# Patient Record
Sex: Female | Born: 1990
Health system: Southern US, Community
[De-identification: ages and names within clinical notes are randomized; demographics above are authoritative.]

## PROBLEM LIST (undated history)

## (undated) DIAGNOSIS — R030 Elevated blood-pressure reading, without diagnosis of hypertension: Secondary | ICD-10-CM

## (undated) DIAGNOSIS — M2340 Loose body in knee, unspecified knee: Secondary | ICD-10-CM

## (undated) DIAGNOSIS — K219 Gastro-esophageal reflux disease without esophagitis: Secondary | ICD-10-CM

## (undated) HISTORY — PX: KNEE ARTHROSCOPY: SUR90

---
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
## 2012-09-25 DIAGNOSIS — M2340 Loose body in knee, unspecified knee: Secondary | ICD-10-CM

## 2012-09-25 HISTORY — DX: Loose body in knee, unspecified knee: M23.40

## 2012-10-10 ENCOUNTER — Encounter (HOSPITAL_BASED_OUTPATIENT_CLINIC_OR_DEPARTMENT_OTHER): Payer: Self-pay | Admitting: *Deleted
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
## 2012-10-13 ENCOUNTER — Ambulatory Visit (HOSPITAL_BASED_OUTPATIENT_CLINIC_OR_DEPARTMENT_OTHER)
Admission: RE | Admit: 2012-10-13 | Discharge: 2012-10-13 | Disposition: A | Discharge status: Home / Self Care | Payer: No Typology Code available for payment source | Source: Ambulatory Visit | Attending: Orthopedic Surgery | Admitting: Orthopedic Surgery

## 2012-10-13 ENCOUNTER — Encounter (HOSPITAL_BASED_OUTPATIENT_CLINIC_OR_DEPARTMENT_OTHER): Payer: Self-pay | Admitting: *Deleted

## 2012-10-13 ENCOUNTER — Encounter (HOSPITAL_BASED_OUTPATIENT_CLINIC_OR_DEPARTMENT_OTHER)
Admission: RE | Disposition: A | Discharge status: Home / Self Care | Payer: Self-pay | Source: Ambulatory Visit | Attending: Orthopedic Surgery

## 2012-10-13 ENCOUNTER — Encounter (HOSPITAL_BASED_OUTPATIENT_CLINIC_OR_DEPARTMENT_OTHER): Payer: Self-pay | Admitting: Certified Registered"

## 2012-10-13 ENCOUNTER — Ambulatory Visit (HOSPITAL_BASED_OUTPATIENT_CLINIC_OR_DEPARTMENT_OTHER): Payer: No Typology Code available for payment source | Admitting: Certified Registered"

## 2012-10-13 DIAGNOSIS — K219 Gastro-esophageal reflux disease without esophagitis: Secondary | ICD-10-CM | POA: Insufficient documentation

## 2012-10-13 DIAGNOSIS — Z9889 Other specified postprocedural states: Secondary | ICD-10-CM

## 2012-10-13 DIAGNOSIS — M2240 Chondromalacia patellae, unspecified knee: Secondary | ICD-10-CM | POA: Insufficient documentation

## 2012-10-13 DIAGNOSIS — M2340 Loose body in knee, unspecified knee: Secondary | ICD-10-CM | POA: Insufficient documentation

## 2012-10-13 HISTORY — PX: KNEE ARTHROSCOPY: SHX127

## 2012-10-13 HISTORY — DX: Gastro-esophageal reflux disease without esophagitis: K21.9

## 2012-10-13 HISTORY — DX: Loose body in knee, unspecified knee: M23.40

## 2012-10-13 SURGERY — ARTHROSCOPY, KNEE
Anesthesia: General | Site: Knee | Laterality: Right

## 2012-10-13 MED ORDER — FENTANYL CITRATE 0.05 MG/ML IJ SOLN: 50.0000 ug | INTRAMUSCULAR | Status: DC | PRN

## 2012-10-13 MED ORDER — DEXAMETHASONE SODIUM PHOSPHATE 4 MG/ML IJ SOLN
INTRAMUSCULAR | Status: DC | PRN
  Administered 2012-10-13: 8 mg via INTRAVENOUS

## 2012-10-13 MED ORDER — ACETAMINOPHEN 10 MG/ML IV SOLN: 1000.0000 mg | Freq: Once | INTRAVENOUS | Status: DC

## 2012-10-13 MED ORDER — OXYCODONE HCL 5 MG PO TABS
5.0000 mg | ORAL_TABLET | Freq: Once | ORAL | Status: AC | PRN
  Administered 2012-10-13: 5 mg via ORAL

## 2012-10-13 MED ORDER — LACTATED RINGERS IV SOLN
INTRAVENOUS | Status: DC
  Administered 2012-10-13 (×2): via INTRAVENOUS

## 2012-10-13 MED ORDER — LIDOCAINE HCL (CARDIAC) 20 MG/ML IV SOLN
INTRAVENOUS | Status: DC | PRN
  Administered 2012-10-13: 50 mg via INTRAVENOUS

## 2012-10-13 MED ORDER — MIDAZOLAM HCL 5 MG/5ML IJ SOLN
INTRAMUSCULAR | Status: DC | PRN
  Administered 2012-10-13: 2 mg via INTRAVENOUS

## 2012-10-13 MED ORDER — CEFAZOLIN SODIUM-DEXTROSE 2-3 GM-% IV SOLR
2.0000 g | INTRAVENOUS | Status: AC
  Administered 2012-10-13: 2 g via INTRAVENOUS

## 2012-10-13 MED ORDER — ACETAMINOPHEN 10 MG/ML IV SOLN
1000.0000 mg | Freq: Once | INTRAVENOUS | Status: AC
  Administered 2012-10-13: 1000 mg via INTRAVENOUS

## 2012-10-13 MED ORDER — MIDAZOLAM HCL 2 MG/2ML IJ SOLN: 1.0000 mg | INTRAMUSCULAR | Status: DC | PRN

## 2012-10-13 MED ORDER — HYDROMORPHONE HCL PF 1 MG/ML IJ SOLN
0.2500 mg | INTRAMUSCULAR | Status: DC | PRN
  Administered 2012-10-13 (×2): 0.5 mg via INTRAVENOUS

## 2012-10-13 MED ORDER — FENTANYL CITRATE 0.05 MG/ML IJ SOLN
INTRAMUSCULAR | Status: DC | PRN
  Administered 2012-10-13: 50 ug via INTRAVENOUS
  Administered 2012-10-13: 100 ug via INTRAVENOUS
  Administered 2012-10-13: 50 ug via INTRAVENOUS

## 2012-10-13 MED ORDER — PROPOFOL 10 MG/ML IV BOLUS
INTRAVENOUS | Status: DC | PRN
  Administered 2012-10-13: 200 mg via INTRAVENOUS

## 2012-10-13 MED ORDER — HYDROCODONE-ACETAMINOPHEN 7.5-325 MG PO TABS: 1.0000 | ORAL_TABLET | Freq: Four times a day (QID) | ORAL | Status: DC | PRN

## 2012-10-13 MED ORDER — OXYCODONE HCL 5 MG/5ML PO SOLN: 5.0000 mg | Freq: Once | ORAL | Status: AC | PRN

## 2012-10-13 MED ORDER — KETOROLAC TROMETHAMINE 30 MG/ML IJ SOLN: 30.0000 mg | Freq: Once | INTRAMUSCULAR | Status: DC

## 2012-10-13 SURGICAL SUPPLY — 38 items
BANDAGE ELASTIC 6 VELCRO ST LF (GAUZE/BANDAGES/DRESSINGS) ×2 IMPLANT
BLADE CUDA 5.5 (BLADE) IMPLANT
BLADE CUDA GRT WHITE 3.5 (BLADE) IMPLANT
BLADE CUTTER GATOR 3.5 (BLADE) ×2 IMPLANT
BLADE CUTTER MENIS 5.5 (BLADE) IMPLANT
BLADE GREAT WHITE 4.2 (BLADE) ×2 IMPLANT
BUR OVAL 4.0 (BURR) IMPLANT
CANISTER OMNI JUG 16 LITER (MISCELLANEOUS) ×2 IMPLANT
CANISTER SUCTION 2500CC (MISCELLANEOUS) IMPLANT
CLOTH BEACON ORANGE TIMEOUT ST (SAFETY) ×2 IMPLANT
CUTTER MENISCUS  4.2MM (BLADE)
CUTTER MENISCUS 4.2MM (BLADE) IMPLANT
DRAPE ARTHROSCOPY W/POUCH 90 (DRAPES) ×2 IMPLANT
DURAPREP 26ML APPLICATOR (WOUND CARE) ×2 IMPLANT
ELECT MENISCUS 165MM 90D (ELECTRODE) IMPLANT
ELECT REM PT RETURN 9FT ADLT (ELECTROSURGICAL)
ELECTRODE REM PT RTRN 9FT ADLT (ELECTROSURGICAL) IMPLANT
GAUZE XEROFORM 1X8 LF (GAUZE/BANDAGES/DRESSINGS) ×2 IMPLANT
GLOVE BIO SURGEON STRL SZ 6.5 (GLOVE) ×2 IMPLANT
GLOVE BIOGEL PI IND STRL 7.0 (GLOVE) ×1 IMPLANT
GLOVE BIOGEL PI IND STRL 8 (GLOVE) ×1 IMPLANT
GLOVE BIOGEL PI INDICATOR 7.0 (GLOVE) ×1
GLOVE BIOGEL PI INDICATOR 8 (GLOVE) ×1
GLOVE ORTHO TXT STRL SZ7.5 (GLOVE) ×4 IMPLANT
GOWN BRE IMP PREV XXLGXLNG (GOWN DISPOSABLE) ×2 IMPLANT
GOWN PREVENTION PLUS XLARGE (GOWN DISPOSABLE) ×4 IMPLANT
HOLDER KNEE FOAM BLUE (MISCELLANEOUS) ×2 IMPLANT
KNEE WRAP E Z 3 GEL PACK (MISCELLANEOUS) ×2 IMPLANT
PACK ARTHROSCOPY DSU (CUSTOM PROCEDURE TRAY) ×2 IMPLANT
PACK BASIN DAY SURGERY FS (CUSTOM PROCEDURE TRAY) ×2 IMPLANT
PENCIL BUTTON HOLSTER BLD 10FT (ELECTRODE) IMPLANT
SET ARTHROSCOPY TUBING (MISCELLANEOUS) ×1
SET ARTHROSCOPY TUBING LN (MISCELLANEOUS) ×1 IMPLANT
SPONGE GAUZE 4X4 12PLY (GAUZE/BANDAGES/DRESSINGS) ×4 IMPLANT
SUT ETHILON 3 0 PS 1 (SUTURE) ×2 IMPLANT
SUT VIC AB 3-0 FS2 27 (SUTURE) IMPLANT
TOWEL OR 17X24 6PK STRL BLUE (TOWEL DISPOSABLE) ×2 IMPLANT
WATER STERILE IRR 1000ML POUR (IV SOLUTION) ×2 IMPLANT

## 2012-10-13 NOTE — Anesthesia Procedure Notes (Signed)
Procedure Name: LMA Insertion Performed by: Lance Coon Pre-anesthesia Checklist: Patient identified, Timeout performed, Emergency Drugs available, Suction available and Patient being monitored Patient Re-evaluated:Patient Re-evaluated prior to inductionOxygen Delivery Method: Circle system utilized Preoxygenation: Pre-oxygenation with 100% oxygen Intubation Type: IV induction Ventilation: Mask ventilation without difficulty LMA: LMA inserted LMA Size: 3.0 Number of attempts: 1 Placement Confirmation: breath sounds checked- equal and bilateral and positive ETCO2 Tube secured with: Tape Dental Injury: Teeth and Oropharynx as per pre-operative assessment

## 2012-10-13 NOTE — Brief Op Note (Signed)
10/13/2012  2:09 PM  PATIENT:  [name]  21 y.o. female  PRE-OPERATIVE DIAGNOSIS:  RIGHT KNEE LOOSE BODY - OTHER SPECIFIED SITES 718.18  POST-OPERATIVE DIAGNOSIS:  RIGHT KNEE LOOSE BODIES  PROCEDURE:  Procedure(s) (LRB) with comments: ARTHROSCOPY KNEE (Right) - RIGHT KNEE ARTHROSCOPY  REMOVAL LOOSE BODIES  SURGEON:  Surgeon(s) and Role:    * Loreta Ave, MD - Primary  PHYSICIAN ASSISTANT: Zonia Kief M     ANESTHESIA:   general  EBL:  Total I/O In: 1400 [I.V.:1400] Out: -   SPECIMEN:  No Specimen  DISPOSITION OF SPECIMEN:  N/A  COUNTS:  YES  TOURNIQUET:  * No tourniquets in log *   PATIENT DISPOSITION:  PACU - hemodynamically stable.

## 2012-10-13 NOTE — Anesthesia Preprocedure Evaluation (Signed)
Anesthesia Evaluation  Patient identified by MRN, date of birth, ID band Patient awake    Reviewed: Allergy & Precautions, H&P , NPO status , Patient's Chart, lab work & pertinent test results  Airway Mallampati: I TM Distance: >3 FB Neck ROM: Full    Dental No notable dental hx. (+) Teeth Intact and Dental Advisory Given   Pulmonary neg pulmonary ROS,  breath sounds clear to auscultation  Pulmonary exam normal       Cardiovascular negative cardio ROS  Rhythm:Regular Rate:Normal     Neuro/Psych negative neurological ROS  negative psych ROS   GI/Hepatic Neg liver ROS, GERD-  Medicated and Controlled,  Endo/Other  negative endocrine ROS  Renal/GU negative Renal ROS  negative genitourinary   Musculoskeletal   Abdominal   Peds  Hematology negative hematology ROS (+)   Anesthesia Other Findings   Reproductive/Obstetrics negative OB ROS                           Anesthesia Physical Anesthesia Plan  ASA: II  Anesthesia Plan: General   Post-op Pain Management:    Induction: Intravenous  Airway Management Planned: LMA  Additional Equipment:   Intra-op Plan:   Post-operative Plan: Extubation in OR  Informed Consent: I have reviewed the patients History and Physical, chart, labs and discussed the procedure including the risks, benefits and alternatives for the proposed anesthesia with the patient or authorized representative who has indicated his/her understanding and acceptance.   Dental advisory given  Plan Discussed with: CRNA  Anesthesia Plan Comments:         Anesthesia Quick Evaluation

## 2012-10-13 NOTE — Interval H&P Note (Signed)
History and Physical Interval Note:  10/13/2012 7:31 AM  [name]  has presented today for surgery, with the diagnosis of RIGHT KNEE LOOSE BODY - OTHER SPECIFIED SITES 718.18  The various methods of treatment have been discussed with the patient and family. After consideration of risks, benefits and other options for treatment, the patient has consented to  Procedure(s) (LRB) with comments: ARTHROSCOPY KNEE (Right) - RIGHT KNEE ARTHROSCOPY  REMOVAL LOOSE FOREIGN BODY as a surgical intervention .  The patient's history has been reviewed, patient examined, no change in status, stable for surgery.  I have reviewed the patient's chart and labs.  Questions were answered to the patient's satisfaction.     Jessamine Barcia F

## 2012-10-13 NOTE — Anesthesia Postprocedure Evaluation (Signed)
  Anesthesia Post-op Note  Patient: [name]  Procedure(s) Performed: Procedure(s) (LRB) with comments: ARTHROSCOPY KNEE (Right) - RIGHT KNEE ARTHROSCOPY  REMOVAL LOOSE BODIES  Patient Location: PACU  Anesthesia Type:General  Level of Consciousness: awake, alert  and oriented  Airway and Oxygen Therapy: Patient Spontanous Breathing  Post-op Pain: moderate  Post-op Assessment: Post-op Vital signs reviewed, Patient's Cardiovascular Status Stable, Respiratory Function Stable, Patent Airway and No signs of Nausea or vomiting  Post-op Vital Signs: Reviewed and stable  Complications: No apparent anesthesia complications

## 2012-10-13 NOTE — Transfer of Care (Signed)
Immediate Anesthesia Transfer of Care Note  Patient: [name]  Procedure(s) Performed: Procedure(s) (LRB) with comments: ARTHROSCOPY KNEE (Right) - RIGHT KNEE ARTHROSCOPY  REMOVAL LOOSE BODIES  Patient Location: PACU  Anesthesia Type:General  Level of Consciousness: awake and alert   Airway & Oxygen Therapy: Patient Spontanous Breathing and Patient connected to face mask oxygen  Post-op Assessment: Report given to PACU RN and Post -op Vital signs reviewed and stable  Post vital signs: Reviewed and stable  Complications: No apparent anesthesia complications

## 2012-10-14 ENCOUNTER — Encounter (HOSPITAL_BASED_OUTPATIENT_CLINIC_OR_DEPARTMENT_OTHER): Payer: Self-pay | Admitting: Orthopedic Surgery

## 2012-10-14 NOTE — Op Note (Signed)
NAMEDESTINA, MANTEI NO.:  [id number]  MEDICAL RECORD NO.:  [id number]  LOCATION:                                 FACILITY:  PHYSICIAN:  Loreta Ave, M.D. DATE OF BIRTH:  1991-02-25  DATE OF PROCEDURE:  10/13/2012 DATE OF DISCHARGE:                              OPERATIVE REPORT   POSTOPERATIVE DIAGNOSES: 1. Right knee status post stabilization, patellofemoral joint for     dislocation with solid reconstruction. 2. New traumatic event with chondromalacia patella and at least 1, if     not 2, osteochondral loose bodies.  POSTOPERATIVE DIAGNOSES: 1. Right knee status post stabilization, patellofemoral joint for     dislocation with solid reconstruction. 2. New traumatic event with chondromalacia patella and at least 1, if     not 2, osteochondral loose bodies. 3. One large osteochondral loose body, bilateral femoral condyle,     smaller one directly behind cruciate ligaments. 4. Focal grade 2, mild grade 3 chondromalacia of small area medial     patella with some chondral debris.  PROCEDURES: 1. Right knee exam under anesthesia, arthroscopy. 2. Chondroplasty patella. 3. Removal of loose bodies.  SURGEON:  Loreta Ave, MD  ASSISTANT:  Genene Churn. Barry Dienes, PA  ANESTHESIA:  General.  ESTIMATED BLOOD LOSS:  Minimal.  SPECIMENS:  None.  CULTURES:  None.  COMPLICATIONS:  None.  DRESSINGS:  Soft compressive.  TOURNIQUET:  Not employed.  DESCRIPTION OF PROCEDURE:  The patient was brought to the operating room, placed on the operating table in supine position.  After adequate anesthesia had been obtained, knee was examined.  Good motion, excellent patellar stability and tracking, no tethering.  Other ligaments stable. Leg holder applied.  Leg prepped and draped in usual sterile fashion. Two portals, one each medial and lateral parapatellar.  Arthroscope was introduced.  Knee was distended and inspected.  Focal area grade 2, mild grade 3  chondromalacia in a small area medial facet debrided.  No instability, good tracking, no tethering.  Trochlea looked good.  Little bit of chondral debris cleared out.  Medial meniscus, medial compartment, lateral meniscus, lateral compartment cruciate ligaments intact.  Looking behind the lateral femoral condyle through the notch, a 1-cm osteochondral loose body was found, brought out from the knee and removed with the large grasping forceps.  Another smaller one behind the cruciate ligaments removed.  I then sequentially examined the entire knee including posteromedial, posterolateral going between cruciate ligaments and down both gutters.  No other loose bodies seen. Instruments were removed.  Portals closed with nylon.  Knee injected with Marcaine.  Sterile compressive dressing applied.  Anesthesia reversed.  Brought to the recovery room.  Tolerated the surgery well. No complications.     Loreta Ave, M.D.     DFM/MEDQ  D:  10/13/2012  T:  10/14/2012  Job:  [id number]

## 2013-01-24 ENCOUNTER — Emergency Department: Payer: Self-pay | Admitting: Emergency Medicine

## 2016-01-17 ENCOUNTER — Other Ambulatory Visit: Payer: Self-pay | Admitting: Urgent Care

## 2016-01-17 DIAGNOSIS — R1084 Generalized abdominal pain: Secondary | ICD-10-CM

## 2016-01-18 ENCOUNTER — Emergency Department
Admission: EM | Admit: 2016-01-18 | Discharge: 2016-01-18 | Disposition: A | Discharge status: Home / Self Care | Payer: No Typology Code available for payment source | Attending: Emergency Medicine | Admitting: Emergency Medicine

## 2016-01-18 ENCOUNTER — Encounter: Payer: Self-pay | Admitting: Emergency Medicine

## 2016-01-18 DIAGNOSIS — R197 Diarrhea, unspecified: Secondary | ICD-10-CM | POA: Insufficient documentation

## 2016-01-18 DIAGNOSIS — Z888 Allergy status to other drugs, medicaments and biological substances status: Secondary | ICD-10-CM | POA: Insufficient documentation

## 2016-01-18 DIAGNOSIS — M545 Low back pain, unspecified: Secondary | ICD-10-CM

## 2016-01-18 DIAGNOSIS — R112 Nausea with vomiting, unspecified: Secondary | ICD-10-CM | POA: Insufficient documentation

## 2016-01-18 LAB — COMPREHENSIVE METABOLIC PANEL
ALBUMIN: 4.2 g/dL (ref 3.5–5.0)
ALT: 20 U/L (ref 14–54)
AST: 18 U/L (ref 15–41)
Alkaline Phosphatase: 80 U/L (ref 38–126)
Anion gap: 5 (ref 5–15)
BUN: 10 mg/dL (ref 6–20)
CHLORIDE: 108 mmol/L (ref 101–111)
CO2: 25 mmol/L (ref 22–32)
CREATININE: 0.65 mg/dL (ref 0.44–1.00)
Calcium: 8.7 mg/dL — ABNORMAL LOW (ref 8.9–10.3)
GFR calc Af Amer: >60 mL/min (ref >60)
GLUCOSE: 121 mg/dL — AB (ref 65–99)
POTASSIUM: 4.1 mmol/L (ref 3.5–5.1)
Sodium: 138 mmol/L (ref 135–145)
Total Bilirubin: 0.3 mg/dL (ref 0.3–1.2)
Total Protein: 7.6 g/dL (ref 6.5–8.1)

## 2016-01-18 LAB — CBC
HCT: 37.4 % (ref 35.0–47.0)
HEMOGLOBIN: 12.8 g/dL (ref 12.0–16.0)
MCH: 28.8 pg (ref 26.0–34.0)
MCHC: 34.3 g/dL (ref 32.0–36.0)
MCV: 84.1 fL (ref 80.0–100.0)
PLATELETS: 428 10*3/uL (ref 150–440)
RBC: 4.45 MIL/uL (ref 3.80–5.20)
RDW: 13.7 % (ref 11.5–14.5)
WBC: 7.8 10*3/uL (ref 3.6–11.0)

## 2016-01-18 LAB — URINALYSIS COMPLETE WITH MICROSCOPIC (ARMC ONLY)
Bacteria, UA: NONE SEEN
Bilirubin Urine: NEGATIVE
Glucose, UA: NEGATIVE mg/dL
Hgb urine dipstick: NEGATIVE
KETONES UR: NEGATIVE mg/dL
Nitrite: NEGATIVE
PH: 6 (ref 5.0–8.0)
PROTEIN: NEGATIVE mg/dL
Specific Gravity, Urine: 1.026 (ref 1.005–1.030)

## 2016-01-18 LAB — POCT PREGNANCY, URINE: PREG TEST UR: NEGATIVE

## 2016-01-18 MED ORDER — KETOROLAC TROMETHAMINE 30 MG/ML IJ SOLN
60.0000 mg | Freq: Once | INTRAMUSCULAR | Status: AC
  Administered 2016-01-18: 60 mg via INTRAMUSCULAR
  Filled 2016-01-18: qty 2

## 2016-01-18 MED ORDER — KETOROLAC TROMETHAMINE 10 MG PO TABS: 10.0000 mg | ORAL_TABLET | Freq: Three times a day (TID) | ORAL | Status: DC | PRN

## 2016-01-18 NOTE — ED Provider Notes (Signed)
[facility] Emergency Department Provider Note  ____________________________________________  Time seen: Approximately 2:38 PM  I have reviewed the triage vital signs and the nursing notes.   HISTORY  Chief Complaint Flank Pain    HPI [name] is a 25 y.o. female , otherwise healthy, presenting with low back pain. Patient states that 8 days ago she had 24 hours of nausea vomiting and diarrhea with epigastric pain that radiated to the back. She was out of town during this time and was seen by a physician who prescribed antinausea and antidiarrheal medications for her. Those symptoms resolved. Since then, she has had a diffuse low back pain.  She was given ciprofloxacin and naproxen 4 days ago.  She reports short periods of relief with naproxen, but no improvement with a heating pad. She also tried some hydrocodone which she had at home, but she has run out. She denies any fever, chills, urinary symptoms, hematuria, change in vaginal discharge. LMP 2/17.   Past Medical History  Diagnosis Date  . GERD (gastroesophageal reflux disease)     Zantac as needed  . Loose body in knee 09/2012    right    There are no active problems to display for this patient.   Past Surgical History  Procedure Laterality Date  . Knee arthroscopy  03/07/2009; and 2012    right  . Knee arthroscopy  10/13/2012    Procedure: ARTHROSCOPY KNEE;  Surgeon: Loreta Ave, MD;  Location: [facility];  Service: Orthopedics;  Laterality: Right;  RIGHT KNEE ARTHROSCOPY  REMOVAL LOOSE BODIES    Current Outpatient Rx  Name  Route  Sig  Dispense  Refill  . HYDROcodone-acetaminophen (NORCO) 7.5-325 MG per tablet   Oral   Take 1-2 tablets by mouth every 6 (six) hours as needed for pain.   60 tablet   0   . ketorolac (TORADOL) 10 MG tablet   Oral   Take 1 tablet (10 mg total) by mouth every 8 (eight) hours as needed for moderate pain (with food).   15 tablet   0   .  norethindrone-ethinyl estradiol 1/35 (ORTHO-NOVUM, NORTREL,CYCLAFEM) tablet   Oral   Take 1 tablet by mouth daily.         . ranitidine (ZANTAC) 150 MG tablet   Oral   Take 150 mg by mouth 2 (two) times daily.           Allergies Chlorhexidine gluconate and Voltaren  No family history on file.  Social History Social History  Substance Use Topics  . Smoking status: Never Smoker   . Smokeless tobacco: Never Used  . Alcohol Use: Yes    Review of Systems Constitutional: No fever/chillsArea and no syncope or lightheadedness. Eyes: No visual changes. ENT: No sore throat. No congestion or rhinorrhea. Cardiovascular: Denies chest pain, palpitations. Respiratory: Denies shortness of breath.  No cough. Gastrointestinal: Positive resolved epigastric pain.  Resolved nausea vomiting and diarrhea.  No constipation. Genitourinary: Negative for dysuria. No hematuria. No frequency. No change in vaginal discharge. Musculoskeletal: Negative for back pain. Skin: Negative for rash. Neurological: Negative for headaches, focal weakness or numbness.  10-point ROS otherwise negative.  ____________________________________________   PHYSICAL EXAM:  VITAL SIGNS: ED Triage Vitals  Enc Vitals Group     BP 01/18/16 1116 127/77 mmHg     Pulse Rate 01/18/16 1116 87     Resp 01/18/16 1116 20     Temp 01/18/16 1116 98.1 F (36.7 C)  Temp Source 01/18/16 1116 Oral     SpO2 01/18/16 1116 100 %     Weight 01/18/16 1116 175 lb (79.379 kg)     Height 01/18/16 1116 5\' 5"  (1.651 m)     Head Cir --      Peak Flow --      Pain Score 01/18/16 1116 6     Pain Loc --      Pain Edu? --      Excl. in GC? --     Constitutional: Alert and oriented. Well appearing and in no acute distress. Answer question appropriately. Eyes: Conjunctivae are normal.  EOMI.No scleral icterus. Head: Atraumatic. Nose: No congestion/rhinnorhea. Mouth/Throat: Mucous membranes are moist.  Neck: No stridor.  Supple.    Cardiovascular: Normal rate, regular rhythm. No murmurs, rubs or gallops.  Respiratory: Normal respiratory effort.  No retractions. Lungs CTAB.  No wheezes, rales or ronchi. Gastrointestinal: Abdomen is soft, nontender nondistended. No guarding or rebound, no peritoneal signs. Negative Murphy sign. No CVA tenderness bilaterally. Musculoskeletal: No LE edema. No midline C, T, L spine tenderness to palpation. No step-offs or deformities. Neurologic:  Normal speech and language. No gross focal neurologic deficits are appreciated.  Skin:  Skin is warm, dry and intact. No rash noted. Psychiatric: Mood and affect are normal. Speech and behavior are normal.  Normal judgement. ____________________________________________   LABS (all labs ordered are listed, but only abnormal results are displayed)  Labs Reviewed  URINALYSIS COMPLETEWITH MICROSCOPIC ([facility] ONLY) - Abnormal; Notable for the following:    Color, Urine YELLOW (*)    APPearance HAZY (*)    Leukocytes, UA 1+ (*)    Squamous Epithelial / LPF 0-5 (*)    All other components within normal limits  COMPREHENSIVE METABOLIC PANEL - Abnormal; Notable for the following:    Glucose, Bld 121 (*)    Calcium 8.7 (*)    All other components within normal limits  CBC  POC URINE PREG, ED  POCT PREGNANCY, URINE   ____________________________________________  EKG  Not indicated ____________________________________________  RADIOLOGY  No results found.  ____________________________________________   PROCEDURES  Procedure(s) performed: None  Critical Care performed: No ____________________________________________   INITIAL IMPRESSION / ASSESSMENT AND PLAN / ED COURSE  Pertinent labs & imaging results that were available during my care of the patient were reviewed by me and considered in my medical decision making (see chart for details).  25 y.o. female with resolved nausea vomiting and diarrhea as well as epigastric pain  presenting with residual low back pain. I am unable to reproduce her pain and she does not appear to have any flank pain or abdominal pain. It is possible that the patient has some musculoskeletal pain, or that she may have exacerbated her muscles during vomiting or with some mild dehydration earlier in the week. At this time, her vital signs are stable and she does not have any evidence of acute abdominal surgical pathology. I talked to her extensively about return precautions, but given her reassuring labs and negative urinalysis, we will plan to treat her conservatively with symptom management. Plan discharge.  ____________________________________________  FINAL CLINICAL IMPRESSION(S) / ED DIAGNOSES  Final diagnoses:  Midline low back pain without sciatica  Nausea vomiting and diarrhea      NEW MEDICATIONS STARTED DURING THIS VISIT:  New Prescriptions   KETOROLAC (TORADOL) 10 MG TABLET    Take 1 tablet (10 mg total) by mouth every 8 (eight) hours as needed for moderate pain (with food).  Rockne Menghini, MD 01/18/16 1443

## 2016-01-18 NOTE — ED Notes (Signed)
R flank pain x 6 days. Denies fevers. Denies dysuria.

## 2016-01-18 NOTE — Discharge Instructions (Signed)
Please apply a heating pad to your low back for 10 minutes every 2 hours. You may also take Toradol, with food, for pain.  You may alternate Toradol with Tylenol as needed.  Stop taking the Naproxsyn and do not take any other NSAID medications while you are taking Toradol.  Please make a follow up appointment with your primary care doctor, or a primary care doctor at the [facility].  Return to the emergency department for severe pain, fever, inability to keep down fluids or any other symptoms concerning to you.

## 2016-07-21 ENCOUNTER — Other Ambulatory Visit: Payer: Self-pay | Admitting: Gastroenterology

## 2016-07-21 DIAGNOSIS — R1013 Epigastric pain: Secondary | ICD-10-CM

## 2016-07-27 ENCOUNTER — Ambulatory Visit
Admission: RE | Admit: 2016-07-27 | Discharge: 2016-07-27 | Disposition: A | Discharge status: Home / Self Care | Payer: 59 | Source: Ambulatory Visit | Attending: Gastroenterology | Admitting: Gastroenterology

## 2016-07-27 DIAGNOSIS — R1013 Epigastric pain: Secondary | ICD-10-CM | POA: Insufficient documentation

## 2016-09-04 ENCOUNTER — Other Ambulatory Visit: Payer: Self-pay | Admitting: Gastroenterology

## 2016-09-04 DIAGNOSIS — R1013 Epigastric pain: Secondary | ICD-10-CM

## 2016-09-16 ENCOUNTER — Ambulatory Visit
Admission: RE | Admit: 2016-09-16 | Discharge: 2016-09-16 | Disposition: A | Discharge status: Home / Self Care | Payer: 59 | Source: Ambulatory Visit | Attending: Gastroenterology | Admitting: Gastroenterology

## 2016-09-16 ENCOUNTER — Encounter
Admission: RE | Admit: 2016-09-16 | Discharge: 2016-09-16 | Disposition: A | Discharge status: Home / Self Care | Payer: 59 | Source: Ambulatory Visit | Attending: Gastroenterology | Admitting: Gastroenterology

## 2016-09-16 DIAGNOSIS — R1013 Epigastric pain: Secondary | ICD-10-CM

## 2016-09-16 MED ORDER — TECHNETIUM TC 99M MEBROFENIN IV KIT
5.0000 | PACK | Freq: Once | INTRAVENOUS | Status: AC | PRN
  Administered 2016-09-16: 4.448 via INTRAVENOUS

## 2016-10-16 ENCOUNTER — Encounter
Admission: RE | Admit: 2016-10-16 | Discharge: 2016-10-16 | Disposition: A | Discharge status: Home / Self Care | Payer: 59 | Source: Ambulatory Visit | Attending: Surgery | Admitting: Surgery

## 2016-10-16 HISTORY — DX: Elevated blood-pressure reading, without diagnosis of hypertension: R03.0

## 2016-10-16 NOTE — Patient Instructions (Signed)
  Your procedure is scheduled on: 10-23-16 Report to Same Day Surgery 2nd floor medical mall [facility](Medical Mall Entrance-take elevator on left to 2nd floor.  Check in with surgery information desk.) To find out your arrival time please call [phone] between 1PM - 3PM on 10-22-16  Remember: Instructions that are not followed completely may result in serious medical risk, up to and including death, or upon the discretion of your surgeon and anesthesiologist your surgery may need to be rescheduled.    _x___ 1. Do not eat food or drink liquids after midnight. No gum chewing or hard candies.     __x__ 2. No Alcohol for 24 hours before or after surgery.   __x__3. No Smoking for 24 prior to surgery.   ____  4. Bring all medications with you on the day of surgery if instructed.    __x__ 5. Notify your doctor if there is any change in your medical condition     (cold, fever, infections).     Do not wear jewelry, make-up, hairpins, clips or nail polish.  Do not wear lotions, powders, or perfumes. You may wear deodorant.  Do not shave 48 hours prior to surgery. Men may shave face and neck.  Do not bring valuables to the hospital.    [facility] is not responsible for any belongings or valuables.               Contacts, dentures or bridgework may not be worn into surgery.  Leave your suitcase in the car. After surgery it may be brought to your room.  For patients admitted to the hospital, discharge time is determined by your treatment team.   Patients discharged the day of surgery will not be allowed to drive home.  You will need someone to drive you home and stay with you the night of your procedure.    Please read over the following fact sheets that you were given:   [facility] Preparing for Surgery and or MRSA Information   _x___ Take these medicines the morning of surgery with A SIP OF WATER:    1. PROTONIX  2. TAKE A PROTONIX ON Thursday NIGHT BEFORE BED (10-22-16)  3.  ZOLOFT  4.  5.  6.  ____Fleets enema or Magnesium Citrate as directed.   ____ Use CHG Soap or sage wipes as directed on instruction sheet   ____ Use inhalers on the day of surgery and bring to hospital day of surgery  ____ Stop metformin 2 days prior to surgery    ____ Take 1/2 of usual insulin dose the night before surgery and none on the morning of           surgery.   ____ Stop Aspirin, Coumadin, Pllavix ,Eliquis, Effient, or Pradaxa  x__ Stop Anti-inflammatories such as Advil, Aleve, Ibuprofen, Motrin, Naproxen,          Naprosyn, Goodies powders or aspirin products NOW-Ok to take Tylenol.   ____ Stop supplements until after surgery.    ____ Bring C-Pap to the hospital.

## 2016-10-23 ENCOUNTER — Ambulatory Visit
Admission: RE | Admit: 2016-10-23 | Discharge: 2016-10-23 | Disposition: A | Discharge status: Home / Self Care | Payer: 59 | Source: Ambulatory Visit | Attending: Surgery | Admitting: Surgery

## 2016-10-23 ENCOUNTER — Ambulatory Visit: Payer: 59

## 2016-10-23 ENCOUNTER — Ambulatory Visit: Payer: 59 | Admitting: Anesthesiology

## 2016-10-23 ENCOUNTER — Encounter
Admission: RE | Disposition: A | Discharge status: Home / Self Care | Payer: Self-pay | Source: Ambulatory Visit | Attending: Surgery

## 2016-10-23 DIAGNOSIS — F17200 Nicotine dependence, unspecified, uncomplicated: Secondary | ICD-10-CM | POA: Insufficient documentation

## 2016-10-23 DIAGNOSIS — F329 Major depressive disorder, single episode, unspecified: Secondary | ICD-10-CM | POA: Insufficient documentation

## 2016-10-23 DIAGNOSIS — F419 Anxiety disorder, unspecified: Secondary | ICD-10-CM | POA: Diagnosis not present

## 2016-10-23 DIAGNOSIS — K219 Gastro-esophageal reflux disease without esophagitis: Secondary | ICD-10-CM | POA: Diagnosis not present

## 2016-10-23 DIAGNOSIS — K811 Chronic cholecystitis: Secondary | ICD-10-CM | POA: Insufficient documentation

## 2016-10-23 DIAGNOSIS — Z419 Encounter for procedure for purposes other than remedying health state, unspecified: Secondary | ICD-10-CM

## 2016-10-23 DIAGNOSIS — Z888 Allergy status to other drugs, medicaments and biological substances status: Secondary | ICD-10-CM | POA: Insufficient documentation

## 2016-10-23 HISTORY — PX: CHOLECYSTECTOMY: SHX55

## 2016-10-23 LAB — POCT PREGNANCY, URINE: PREG TEST UR: NEGATIVE

## 2016-10-23 LAB — GLUCOSE, CAPILLARY: Glucose-Capillary: 112 mg/dL — ABNORMAL HIGH (ref 65–99)

## 2016-10-23 SURGERY — LAPAROSCOPIC CHOLECYSTECTOMY WITH INTRAOPERATIVE CHOLANGIOGRAM
Anesthesia: General | Wound class: Clean Contaminated

## 2016-10-23 MED ORDER — KETOROLAC TROMETHAMINE 30 MG/ML IJ SOLN
30.0000 mg | Freq: Once | INTRAMUSCULAR | Status: AC
  Administered 2016-10-23: 30 mg via INTRAVENOUS

## 2016-10-23 MED ORDER — ROCURONIUM BROMIDE 50 MG/5ML IV SOSY
PREFILLED_SYRINGE | INTRAVENOUS | Status: AC
  Filled 2016-10-23: qty 5

## 2016-10-23 MED ORDER — HYDROCODONE-ACETAMINOPHEN 5-325 MG PO TABS: 1.0000 | ORAL_TABLET | ORAL | 0 refills | Status: DC | PRN

## 2016-10-23 MED ORDER — MIDAZOLAM HCL 2 MG/2ML IJ SOLN
INTRAMUSCULAR | Status: AC
  Filled 2016-10-23: qty 2

## 2016-10-23 MED ORDER — MIDAZOLAM HCL 2 MG/2ML IJ SOLN
INTRAMUSCULAR | Status: DC | PRN
  Administered 2016-10-23: 2 mg via INTRAVENOUS

## 2016-10-23 MED ORDER — KETOROLAC TROMETHAMINE 30 MG/ML IJ SOLN
INTRAMUSCULAR | Status: AC
  Administered 2016-10-23: 30 mg via INTRAVENOUS
  Filled 2016-10-23: qty 1

## 2016-10-23 MED ORDER — ONDANSETRON HCL 4 MG/2ML IJ SOLN
INTRAMUSCULAR | Status: AC
  Filled 2016-10-23: qty 2

## 2016-10-23 MED ORDER — OXYCODONE HCL 5 MG PO TABS
ORAL_TABLET | ORAL | Status: AC
  Filled 2016-10-23: qty 1

## 2016-10-23 MED ORDER — OXYCODONE HCL 5 MG PO TABS
5.0000 mg | ORAL_TABLET | Freq: Once | ORAL | Status: AC
  Administered 2016-10-23: 5 mg via ORAL

## 2016-10-23 MED ORDER — ROCURONIUM BROMIDE 100 MG/10ML IV SOLN
INTRAVENOUS | Status: DC | PRN
  Administered 2016-10-23: 40 mg via INTRAVENOUS
  Administered 2016-10-23: 10 mg via INTRAVENOUS
  Administered 2016-10-23: 30 mg via INTRAVENOUS

## 2016-10-23 MED ORDER — LIDOCAINE HCL (CARDIAC) 20 MG/ML IV SOLN
INTRAVENOUS | Status: DC | PRN
  Administered 2016-10-23: 100 mg via INTRAVENOUS

## 2016-10-23 MED ORDER — ONDANSETRON HCL 4 MG/2ML IJ SOLN
INTRAMUSCULAR | Status: AC
  Administered 2016-10-23: 4 mg via INTRAVENOUS
  Filled 2016-10-23: qty 2

## 2016-10-23 MED ORDER — LIDOCAINE 2% (20 MG/ML) 5 ML SYRINGE
INTRAMUSCULAR | Status: AC
  Filled 2016-10-23: qty 5

## 2016-10-23 MED ORDER — PROPOFOL 10 MG/ML IV BOLUS
INTRAVENOUS | Status: AC
  Filled 2016-10-23: qty 20

## 2016-10-23 MED ORDER — SUGAMMADEX SODIUM 200 MG/2ML IV SOLN
INTRAVENOUS | Status: DC | PRN
  Administered 2016-10-23: 180 mg via INTRAVENOUS

## 2016-10-23 MED ORDER — DEXAMETHASONE SODIUM PHOSPHATE 10 MG/ML IJ SOLN
INTRAMUSCULAR | Status: DC | PRN
  Administered 2016-10-23: 10 mg via INTRAVENOUS

## 2016-10-23 MED ORDER — FENTANYL CITRATE (PF) 100 MCG/2ML IJ SOLN
INTRAMUSCULAR | Status: DC | PRN
  Administered 2016-10-23 (×2): 50 ug via INTRAVENOUS
  Administered 2016-10-23: 100 ug via INTRAVENOUS
  Administered 2016-10-23: 50 ug via INTRAVENOUS

## 2016-10-23 MED ORDER — SODIUM CHLORIDE 0.9 % IV SOLN
INTRAVENOUS | Status: DC | PRN
  Administered 2016-10-23: 5 mL

## 2016-10-23 MED ORDER — FENTANYL CITRATE (PF) 100 MCG/2ML IJ SOLN
INTRAMUSCULAR | Status: AC
  Filled 2016-10-23: qty 2

## 2016-10-23 MED ORDER — LACTATED RINGERS IV SOLN
INTRAVENOUS | Status: DC
  Administered 2016-10-23 (×2): via INTRAVENOUS

## 2016-10-23 MED ORDER — HYDROCODONE-ACETAMINOPHEN 5-325 MG PO TABS
ORAL_TABLET | ORAL | Status: AC
  Filled 2016-10-23: qty 1

## 2016-10-23 MED ORDER — SEVOFLURANE IN SOLN
RESPIRATORY_TRACT | Status: AC
  Filled 2016-10-23: qty 250

## 2016-10-23 MED ORDER — ONDANSETRON HCL 4 MG/2ML IJ SOLN
4.0000 mg | Freq: Once | INTRAMUSCULAR | Status: AC | PRN
  Administered 2016-10-23: 4 mg via INTRAVENOUS

## 2016-10-23 MED ORDER — SODIUM CHLORIDE 0.9 % IJ SOLN
INTRAMUSCULAR | Status: AC
  Filled 2016-10-23: qty 10

## 2016-10-23 MED ORDER — FENTANYL CITRATE (PF) 250 MCG/5ML IJ SOLN
INTRAMUSCULAR | Status: AC
  Filled 2016-10-23: qty 5

## 2016-10-23 MED ORDER — HYDROCODONE-ACETAMINOPHEN 5-325 MG PO TABS
1.0000 | ORAL_TABLET | ORAL | Status: DC | PRN
  Administered 2016-10-23: 1 via ORAL

## 2016-10-23 MED ORDER — DEXAMETHASONE SODIUM PHOSPHATE 10 MG/ML IJ SOLN
INTRAMUSCULAR | Status: AC
  Filled 2016-10-23: qty 1

## 2016-10-23 MED ORDER — PROPOFOL 10 MG/ML IV BOLUS
INTRAVENOUS | Status: DC | PRN
  Administered 2016-10-23: 170 mg via INTRAVENOUS

## 2016-10-23 MED ORDER — SODIUM CHLORIDE 0.9 % IV SOLN
INTRAVENOUS | Status: DC | PRN
  Administered 2016-10-23: 50 mL via INTRAMUSCULAR

## 2016-10-23 MED ORDER — SODIUM CHLORIDE 0.9 % IJ SOLN
INTRAMUSCULAR | Status: AC
  Filled 2016-10-23: qty 50

## 2016-10-23 MED ORDER — SUGAMMADEX SODIUM 200 MG/2ML IV SOLN
INTRAVENOUS | Status: AC
  Filled 2016-10-23: qty 2

## 2016-10-23 MED ORDER — FENTANYL CITRATE (PF) 100 MCG/2ML IJ SOLN
25.0000 ug | INTRAMUSCULAR | Status: AC | PRN
  Administered 2016-10-23 (×6): 25 ug via INTRAVENOUS

## 2016-10-23 MED ORDER — HEPARIN SODIUM (PORCINE) 5000 UNIT/ML IJ SOLN
INTRAMUSCULAR | Status: AC
  Filled 2016-10-23: qty 1

## 2016-10-23 MED ORDER — ONDANSETRON HCL 4 MG/2ML IJ SOLN
INTRAMUSCULAR | Status: DC | PRN
  Administered 2016-10-23 (×2): 4 mg via INTRAVENOUS

## 2016-10-23 SURGICAL SUPPLY — 43 items
APPLIER CLIP ROT 10 11.4 M/L (STAPLE) ×3
CANISTER SUCT 1200ML W/VALVE (MISCELLANEOUS) ×3 IMPLANT
CANNULA DILATOR 10 W/SLV (CANNULA) ×2 IMPLANT
CANNULA DILATOR 10MM W/SLV (CANNULA) ×1
CATH REDDICK CHOLANGI 4FR 50CM (CATHETERS) ×3 IMPLANT
CHLORAPREP W/TINT 26ML (MISCELLANEOUS) IMPLANT
CLIP APPLIE ROT 10 11.4 M/L (STAPLE) ×1 IMPLANT
CLOSURE WOUND 1/2 X4 (GAUZE/BANDAGES/DRESSINGS) ×1
DRAPE SHEET LG 3/4 BI-LAMINATE (DRAPES) ×3 IMPLANT
DURAPREP 26ML APPLICATOR (WOUND CARE) ×3 IMPLANT
ELECT REM PT RETURN 9FT ADLT (ELECTROSURGICAL) ×3
ELECTRODE REM PT RTRN 9FT ADLT (ELECTROSURGICAL) ×1 IMPLANT
GAUZE SPONGE 4X4 12PLY STRL (GAUZE/BANDAGES/DRESSINGS) ×3 IMPLANT
GLOVE BIO SURGEON STRL SZ 6.5 (GLOVE) ×4 IMPLANT
GLOVE BIO SURGEON STRL SZ7.5 (GLOVE) ×6 IMPLANT
GLOVE BIO SURGEONS STRL SZ 6.5 (GLOVE) ×2
GLOVE SURG SYN 6.5 ES PF (GLOVE) ×6 IMPLANT
GOWN STRL REUS W/ TWL LRG LVL3 (GOWN DISPOSABLE) ×4 IMPLANT
GOWN STRL REUS W/TWL LRG LVL3 (GOWN DISPOSABLE) ×8
IRRIGATION STRYKERFLOW (MISCELLANEOUS) ×1 IMPLANT
IRRIGATOR STRYKERFLOW (MISCELLANEOUS) ×3
IV NS 1000ML (IV SOLUTION) ×2
IV NS 1000ML BAXH (IV SOLUTION) ×1 IMPLANT
KIT RM TURNOVER STRD PROC AR (KITS) ×3 IMPLANT
LABEL OR SOLS (LABEL) IMPLANT
NDL INSUFF ACCESS 14 VERSASTEP (NEEDLE) ×3 IMPLANT
NEEDLE FILTER BLUNT 18X 1/2SAF (NEEDLE) ×2
NEEDLE FILTER BLUNT 18X1 1/2 (NEEDLE) ×1 IMPLANT
NS IRRIG 500ML POUR BTL (IV SOLUTION) ×3 IMPLANT
PACK LAP CHOLECYSTECTOMY (MISCELLANEOUS) ×3 IMPLANT
SCISSORS METZENBAUM CVD 33 (INSTRUMENTS) ×3 IMPLANT
SEAL FOR SCOPE WARMER C3101 (MISCELLANEOUS) ×3 IMPLANT
SLEEVE ENDOPATH XCEL 5M (ENDOMECHANICALS) ×3 IMPLANT
STRIP CLOSURE SKIN 1/2X4 (GAUZE/BANDAGES/DRESSINGS) ×2 IMPLANT
SUT CHROMIC 5 0 RB 1 27 (SUTURE) ×3 IMPLANT
SUT VIC AB 0 CT2 27 (SUTURE) IMPLANT
SWABSTK COMLB BENZOIN TINCTURE (MISCELLANEOUS) ×3 IMPLANT
SYR 3ML LL SCALE MARK (SYRINGE) ×3 IMPLANT
TROCAR XCEL NON-BLD 11X100MML (ENDOMECHANICALS) ×3 IMPLANT
TROCAR XCEL NON-BLD 5MMX100MML (ENDOMECHANICALS) ×3 IMPLANT
TROCAR XCEL UNIV SLVE 11M 100M (ENDOMECHANICALS) ×3 IMPLANT
TUBING INSUFFLATOR HI FLOW (MISCELLANEOUS) ×3 IMPLANT
WATER STERILE IRR 1000ML POUR (IV SOLUTION) ×3 IMPLANT

## 2016-10-23 NOTE — Anesthesia Postprocedure Evaluation (Signed)
Anesthesia Post Note  Patient: [name]  Procedure(s) Performed: Procedure(s) (LRB): LAPAROSCOPIC CHOLECYSTECTOMY WITH INTRAOPERATIVE CHOLANGIOGRAM (N/A)  Patient location during evaluation: PACU Anesthesia Type: General Level of consciousness: awake and alert and oriented Pain management: pain level controlled Vital Signs Assessment: post-procedure vital signs reviewed and stable Respiratory status: spontaneous breathing, nonlabored ventilation and respiratory function stable Cardiovascular status: blood pressure returned to baseline and stable Postop Assessment: no signs of nausea or vomiting Anesthetic complications: no     Last Vitals:  Vitals:   10/23/16 1808 10/23/16 1827  BP: (!) 158/98 130/82  Pulse: 82 84  Resp: 18 18  Temp: 37 C     Last Pain:  Vitals:   10/23/16 1827  TempSrc:   PainSc: 3                  Nani Ingram

## 2016-10-23 NOTE — Anesthesia Preprocedure Evaluation (Addendum)
Anesthesia Evaluation  Patient identified by MRN, date of birth, ID band Patient awake    Reviewed: Allergy & Precautions, H&P , NPO status , Patient's Chart, lab work & pertinent test results  Airway Mallampati: I  TM Distance: >3 FB Neck ROM: Full    Dental no notable dental hx. (+) Teeth Intact, Dental Advisory Given   Pulmonary neg pulmonary ROS, Current Smoker,    Pulmonary exam normal breath sounds clear to auscultation       Cardiovascular negative cardio ROS   Rhythm:Regular Rate:Normal     Neuro/Psych negative neurological ROS  negative psych ROS   GI/Hepatic Neg liver ROS, GERD  Medicated and Controlled,  Endo/Other  negative endocrine ROS  Renal/GU negative Renal ROS  negative genitourinary   Musculoskeletal negative musculoskeletal ROS (+)   Abdominal Normal abdominal exam  (+)   Peds negative pediatric ROS (+)  Hematology negative hematology ROS (+)   Anesthesia Other Findings   Reproductive/Obstetrics negative OB ROS                             Anesthesia Physical  Anesthesia Plan  ASA: II  Anesthesia Plan: General   Post-op Pain Management:    Induction: Intravenous  Airway Management Planned: Oral ETT  Additional Equipment:   Intra-op Plan:   Post-operative Plan: Extubation in OR  Informed Consent: I have reviewed the patients History and Physical, chart, labs and discussed the procedure including the risks, benefits and alternatives for the proposed anesthesia with the patient or authorized representative who has indicated his/her understanding and acceptance.   Dental advisory given  Plan Discussed with: CRNA and Surgeon  Anesthesia Plan Comments:         Anesthesia Quick Evaluation

## 2016-10-23 NOTE — H&P (Signed)
  She has had some mild nausea since yesterday. No additional severe pains since office visit. No change in condition since the office visit.  Lab work noted  I discussed the plan for laparoscopic cholecystectomy

## 2016-10-23 NOTE — Op Note (Signed)
OPERATIVE REPORT  PREOPERATIVE DIAGNOSIS:  Chronic cholecystitis   POSTOPERATIVE DIAGNOSIS: Chronic cholecystitis   PROCEDURE: Laparoscopic cholecystectomy with cholangiogram  ANESTHESIA: General  SURGEON: Renda RollsWilton Smith M.D.  INDICATIONS: She is a history of episodes of severe epigastric pain and also has had pain radiating into the back. She has had a abnormal low gallbladder ejection fraction of 32% and surgery was recommended for definitive treatment.  With the patient on the operating table in the supine position under general endotracheal anesthesia the abdomen was prepared with DuraPrep solution and draped in a sterile manner. A short incision was made in the inferior aspect of the umbilicus and carried down to the deep fascia which was grasped with a laryngeal hook. A Veress needle was inserted aspirated and irrigated with a saline solution. Saline did not flow and easily. The Veress needle was removed. Subsequently the 10 mm 0 laparoscope was placed into the 11 mm cannula and focused and advanced through the abdominal wall through the infraumbilical incision  and entered the peritoneal cavity. The peritoneal cavity was insufflated with carbon dioxide.  Another incision was made in the epigastrium slightly to the right of the midline to introduce an 11 mm cannula. 2 incisions were made in the lateral aspect of the right upper quadrant to introduce 2   5 mm cannulas. Initial inspection revealed a smooth surface of the liver and typical appearance of stomach and visible small and large bowel.  The gallbladder was retracted towards the right shoulder.  The gallbladder neck was retracted inferiorly and laterally.  The porta hepatis was identified. The gallbladder was mobilized with incision of the visceral peritoneum. The cystic artery was dissected free from surrounding structures. The cystic duct was dissected free from surrounding structures. The common bile duct was identified. A critical view  of safety was demonstrated. The cystic artery was anterior to the cystic duct and therefore the cystic artery was controlled with double endoclips and divided. This led to satisfactory exposure of the cystic duct.  An Endo Clip was placed across the cystic duct adjacent to the gallbladder neck. An incision was made in the cystic duct to introduce a Reddick catheter. The cholangiogram was done with injection of half-strength Conray 60 dye. This demonstrated the bile ducts and flow of dye into the duodenum. No retained stones were identified. The cholangiogram appeared normal. The Reddick catheter was removed. The cystic duct was doubly ligated with endoclips and divided. The gallbladder was dissected free from the liver with use of hook and cautery and blunt dissection. Bleeding was minimal and hemostasis was intact. The gallbladder was delivered up through the infraumbilical incision opened and suctioned. Cholesterolosis was identified. The gallbladder was removed and submitted for routine pathology. The right upper quadrant was further inspected and could see hemostasis was intact. The cannulas removed allowing carbon dioxide to escape from the peritoneal cavity. Skin incisions were closed with interrupted 5-0 chromic subcuticular suture benzoin and Steri-Strips. Gauze dressings were applied with paper tape.   The patient appeared to be in satisfactory condition and was prepared for transfer to the recovery room  Renda RollsWilton Smith M.D.

## 2016-10-23 NOTE — Anesthesia Procedure Notes (Signed)
Procedure Name: Intubation Date/Time: 10/23/2016 3:08 PM Performed by: Michaele OfferSAVAGE, Kylle Lall Pre-anesthesia Checklist: Patient identified, Emergency Drugs available, Suction available, Patient being monitored and Timeout performed Patient Re-evaluated:Patient Re-evaluated prior to inductionOxygen Delivery Method: Circle system utilized Preoxygenation: Pre-oxygenation with 100% oxygen Intubation Type: IV induction Ventilation: Mask ventilation without difficulty Laryngoscope Size: Mac and 3 Grade View: Grade I Tube type: Oral Tube size: 7.0 mm Number of attempts: 1 Airway Equipment and Method: Stylet Placement Confirmation: ETT inserted through vocal cords under direct vision,  positive ETCO2 and breath sounds checked- equal and bilateral Secured at: 19 cm Tube secured with: Tape Dental Injury: Teeth and Oropharynx as per pre-operative assessment

## 2016-10-23 NOTE — Discharge Instructions (Addendum)
Take Tylenol or Norco if needed for pain.  Should not drive or do anything dangerous when taking Norco.  Remove dressings on Saturday. May shower Sunday.  Gradually resume usual activities and diet as tolerated.     AMBULATORY SURGERY  DISCHARGE INSTRUCTIONS   1) The drugs that you were given will stay in your system until tomorrow so for the next 24 hours you should not:  A) Drive an automobile B) Make any legal decisions C) Drink any alcoholic beverage   2) You may resume regular meals tomorrow.  Today it is better to start with liquids and gradually work up to solid foods.  You may eat anything you prefer, but it is better to start with liquids, then soup and crackers, and gradually work up to solid foods.   3) Please notify your doctor immediately if you have any unusual bleeding, trouble breathing, redness and pain at the surgery site, drainage, fever, or pain not relieved by medication.    4) Additional Instructions:        Please contact your physician with any problems or Same Day Surgery at [phone], Monday through Friday 6 am to 4 pm, or [place] at [facility] Main number at [phone].

## 2016-10-23 NOTE — Transfer of Care (Signed)
Immediate Anesthesia Transfer of Care Note  Patient: [name]  Procedure(s) Performed: Procedure(s): LAPAROSCOPIC CHOLECYSTECTOMY WITH INTRAOPERATIVE CHOLANGIOGRAM (N/A)  Patient Location: PACU  Anesthesia Type:General  Level of Consciousness: sedated and responds to stimulation  Airway & Oxygen Therapy: Patient Spontanous Breathing and Patient connected to face mask oxygen  Post-op Assessment: Report given to RN and Post -op Vital signs reviewed and stable  Post vital signs: Reviewed  Last Vitals:  Vitals:   10/23/16 1252 10/23/16 1708  BP: (!) 150/91 115/88  Pulse: 89 98  Resp: 16 13  Temp: 36.7 C 36.3 C    Last Pain:  Vitals:   10/23/16 1252  TempSrc: Tympanic         Complications: No apparent anesthesia complications

## 2016-10-24 ENCOUNTER — Encounter: Payer: Self-pay | Admitting: Surgery

## 2016-10-28 LAB — SURGICAL PATHOLOGY

## 2016-12-25 ENCOUNTER — Ambulatory Visit
Admission: RE | Admit: 2016-12-25 | Payer: BLUE CROSS/BLUE SHIELD | Source: Ambulatory Visit | Admitting: Gastroenterology

## 2016-12-25 ENCOUNTER — Encounter: Admission: RE | Payer: Self-pay | Source: Ambulatory Visit

## 2016-12-25 SURGERY — EGD (ESOPHAGOGASTRODUODENOSCOPY)
Anesthesia: General

## 2017-01-11 ENCOUNTER — Other Ambulatory Visit: Payer: Self-pay | Admitting: Internal Medicine

## 2017-01-11 DIAGNOSIS — R7989 Other specified abnormal findings of blood chemistry: Secondary | ICD-10-CM

## 2017-01-11 DIAGNOSIS — E039 Hypothyroidism, unspecified: Secondary | ICD-10-CM

## 2017-01-22 ENCOUNTER — Ambulatory Visit
Admission: RE | Admit: 2017-01-22 | Discharge: 2017-01-22 | Disposition: A | Discharge status: Home / Self Care | Payer: BLUE CROSS/BLUE SHIELD | Source: Ambulatory Visit | Attending: Internal Medicine | Admitting: Internal Medicine

## 2017-01-22 DIAGNOSIS — R7989 Other specified abnormal findings of blood chemistry: Secondary | ICD-10-CM | POA: Insufficient documentation

## 2017-01-22 DIAGNOSIS — E039 Hypothyroidism, unspecified: Secondary | ICD-10-CM

## 2017-01-22 DIAGNOSIS — E038 Other specified hypothyroidism: Secondary | ICD-10-CM | POA: Insufficient documentation

## 2017-01-22 MED ORDER — GADOBENATE DIMEGLUMINE 529 MG/ML IV SOLN
10.0000 mL | Freq: Once | INTRAVENOUS | Status: AC | PRN
  Administered 2017-01-22: 10 mL via INTRAVENOUS

## 2017-11-06 IMAGING — NM NM HEPATO W/GB/PHARM/[PERSON_NAME]
3 series · 18 of 18 positions shown · non-contrast
Comparison: None

CLINICAL DATA: RIGHT upper quadrant abdominal pain, nausea,
vomiting, episodic since December 2015

EXAM:
NUCLEAR MEDICINE HEPATOBILIARY IMAGING WITH GALLBLADDER EF
TECHNIQUE: Sequential images of the abdomen were obtained [DATE] minutes
following intravenous administration of radiopharmaceutical. After
oral ingestion of Ensure, gallbladder ejection fraction was
determined. At 60 min, normal ejection fraction is greater than 33%.
RADIOPHARMACEUTICALS:  4.448 mCi 2c-22m  Choletec IV

[Series 1000: gallbladder ef dynamic (results) · 4.80mm/px · 6 of 120 frames shown]
[frame 11/120]
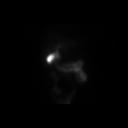
[frame 31/120]
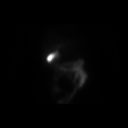
[frame 51/120]
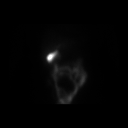
[frame 71/120]
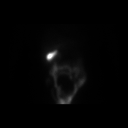
[frame 91/120]
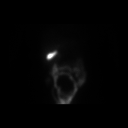
[frame 111/120]
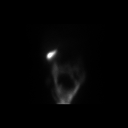

[Series 1000: gallbladder ef dynamic · 4.80mm/px · 6 of 120 frames shown]
[frame 11/120]
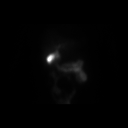
[frame 31/120]
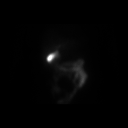
[frame 51/120]
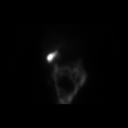
[frame 71/120]
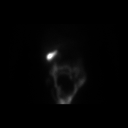
[frame 91/120]
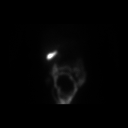
[frame 111/120]
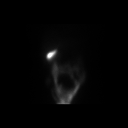

[Series 1000: hepatobiliary dynamic · 9.59mm/px · 6 of 60 frames shown]
[frame 6/60]
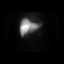
[frame 16/60]
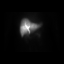
[frame 26/60]
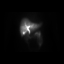
[frame 36/60]
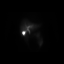
[frame 46/60]
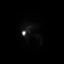
[frame 56/60]
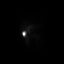

[18 of 18 positions shown; findings below may reference images not displayed]

FINDINGS: Normal tracer extraction from bloodstream indicating normal
hepatocellular function.

Normal excretion of tracer into biliary tree.

Gallbladder visualized at 28 min.

Small bowel visualized at 22 min.

No hepatic retention of tracer.

Subjectively normal emptying of tracer from gallbladder following
fatty meal stimulation.

Calculated gallbladder ejection fraction (GBEF) is borderline low at
32%.

Patient reported RIGHT upper quadrant pain following Ensure
ingestion.

Normal gallbladder ejection fraction following Ensure ingestion is
greater than 33% at 1 hour.
IMPRESSION: Patent biliary tree.

RIGHT upper quadrant pain following Ensure.

Borderline low gallbladder response to fatty meal stimulation, with
GBEF = 32%.

## 2019-10-11 ENCOUNTER — Other Ambulatory Visit: Payer: Self-pay

## 2019-10-11 ENCOUNTER — Ambulatory Visit: Payer: HRSA Program | Attending: Internal Medicine

## 2019-10-11 DIAGNOSIS — Z20822 Contact with and (suspected) exposure to covid-19: Secondary | ICD-10-CM

## 2019-10-11 DIAGNOSIS — Z20828 Contact with and (suspected) exposure to other viral communicable diseases: Secondary | ICD-10-CM | POA: Diagnosis present

## 2019-10-13 LAB — NOVEL CORONAVIRUS, NAA: SARS-CoV-2, NAA: NOT DETECTED

## 2019-10-13 NOTE — Progress Notes (Signed)
Order(s) created erroneously. Erroneous order ID: [id number]  Order moved by: Brigitte Pulse  Order move date/time: 10/13/2019 1:35 PM  Source Patient: [id number]  Source Contact: 10/11/2019  Destination Patient: [id number]  Destination Contact: 10/11/2019

## 2019-10-13 NOTE — Progress Notes (Signed)
Order(s) created erroneously. Erroneous order ID: [id number]  Order moved by: Zenora Karpel M  Order move date/time: 10/13/2019 1:35 PM  Source Patient: [id number]  Source Contact: 10/11/2019  Destination Patient: [id number]  Destination Contact: 10/11/2019 

## 2019-10-13 NOTE — Progress Notes (Signed)
Orders moved to this encounter.  

## 2020-07-05 DIAGNOSIS — N910 Primary amenorrhea: Secondary | ICD-10-CM | POA: Diagnosis not present

## 2020-07-05 DIAGNOSIS — Z3490 Encounter for supervision of normal pregnancy, unspecified, unspecified trimester: Secondary | ICD-10-CM | POA: Diagnosis not present

## 2020-07-05 DIAGNOSIS — E039 Hypothyroidism, unspecified: Secondary | ICD-10-CM | POA: Diagnosis not present

## 2020-07-05 DIAGNOSIS — O200 Threatened abortion: Secondary | ICD-10-CM | POA: Diagnosis not present

## 2020-07-24 DIAGNOSIS — K5901 Slow transit constipation: Secondary | ICD-10-CM | POA: Diagnosis not present

## 2020-07-24 DIAGNOSIS — E039 Hypothyroidism, unspecified: Secondary | ICD-10-CM | POA: Diagnosis not present

## 2020-07-24 DIAGNOSIS — O200 Threatened abortion: Secondary | ICD-10-CM | POA: Diagnosis not present

## 2020-08-01 DIAGNOSIS — E039 Hypothyroidism, unspecified: Secondary | ICD-10-CM | POA: Diagnosis not present

## 2020-08-01 DIAGNOSIS — R947 Abnormal results of other endocrine function studies: Secondary | ICD-10-CM | POA: Diagnosis not present

## 2020-08-05 DIAGNOSIS — Z113 Encounter for screening for infections with a predominantly sexual mode of transmission: Secondary | ICD-10-CM | POA: Diagnosis not present

## 2020-08-05 DIAGNOSIS — Z124 Encounter for screening for malignant neoplasm of cervix: Secondary | ICD-10-CM | POA: Diagnosis not present

## 2020-08-05 DIAGNOSIS — Z3481 Encounter for supervision of other normal pregnancy, first trimester: Secondary | ICD-10-CM | POA: Diagnosis not present

## 2020-08-05 DIAGNOSIS — R87610 Atypical squamous cells of undetermined significance on cytologic smear of cervix (ASC-US): Secondary | ICD-10-CM | POA: Diagnosis not present

## 2020-08-09 DIAGNOSIS — R102 Pelvic and perineal pain: Secondary | ICD-10-CM | POA: Diagnosis not present

## 2020-08-09 DIAGNOSIS — N949 Unspecified condition associated with female genital organs and menstrual cycle: Secondary | ICD-10-CM | POA: Diagnosis not present

## 2020-08-09 DIAGNOSIS — O26891 Other specified pregnancy related conditions, first trimester: Secondary | ICD-10-CM | POA: Diagnosis not present

## 2020-09-03 DIAGNOSIS — E039 Hypothyroidism, unspecified: Secondary | ICD-10-CM | POA: Diagnosis not present

## 2020-10-07 DIAGNOSIS — Z3402 Encounter for supervision of normal first pregnancy, second trimester: Secondary | ICD-10-CM | POA: Diagnosis not present

## 2020-10-26 NOTE — L&D Delivery Note (Signed)
Delivery Note At 3:18 PM a viable female was delivered via Vaginal, Spontaneous (Presentation:      ).  APGAR: 9, 10; weight  .   Placenta status: Spontaneous, Intact.  Cord:   with the following complications:  . none  controlled delivery of head and shoulders . Vigorous female delivered and placed on mother's abdomen. Delayed cord clamping  Placenta delivery 5 minutes PP Anesthesia: None Episiotomy: None Lacerations:  Second degree with bilateral labia  Suture Repair: 2.0 3.0 vicryl Est. Blood Loss (mL):  qbl 400 cc  Mom to postpartum.  Baby to Couplet care / Skin to Skin.  Ihor Austin Yoshito Gaza 03/05/2021, 4:07 PM

## 2020-11-22 DIAGNOSIS — Z3A25 25 weeks gestation of pregnancy: Secondary | ICD-10-CM | POA: Diagnosis not present

## 2020-11-22 DIAGNOSIS — O368320 Maternal care for abnormalities of the fetal heart rate or rhythm, second trimester, not applicable or unspecified: Secondary | ICD-10-CM | POA: Diagnosis not present

## 2020-12-03 DIAGNOSIS — Z3402 Encounter for supervision of normal first pregnancy, second trimester: Secondary | ICD-10-CM | POA: Diagnosis not present

## 2020-12-03 DIAGNOSIS — E039 Hypothyroidism, unspecified: Secondary | ICD-10-CM | POA: Diagnosis not present

## 2021-01-01 ENCOUNTER — Other Ambulatory Visit: Payer: Self-pay

## 2021-01-01 ENCOUNTER — Observation Stay
Admission: EM | Admit: 2021-01-01 | Discharge: 2021-01-01 | Disposition: A | Discharge status: Home / Self Care | Payer: Medicaid Other

## 2021-01-01 DIAGNOSIS — Z3A31 31 weeks gestation of pregnancy: Secondary | ICD-10-CM | POA: Insufficient documentation

## 2021-01-01 DIAGNOSIS — F17210 Nicotine dependence, cigarettes, uncomplicated: Secondary | ICD-10-CM | POA: Insufficient documentation

## 2021-01-01 DIAGNOSIS — O368130 Decreased fetal movements, third trimester, not applicable or unspecified: Principal | ICD-10-CM | POA: Diagnosis present

## 2021-01-01 DIAGNOSIS — O99333 Smoking (tobacco) complicating pregnancy, third trimester: Secondary | ICD-10-CM | POA: Insufficient documentation

## 2021-01-01 NOTE — OB Triage Note (Signed)
Pt G1P0 at [redacted]w[redacted]d presents from ED with c/o no FM since 8am. Pt reports she has ate/drank. Pt denies CTX/LOF. VSS. Monitors applied. Will continue to monitor.

## 2021-01-01 NOTE — Discharge Summary (Signed)
[name] is a 30 y.o. female. She is at [redacted]w[redacted]d gestation. No LMP recorded. Patient is pregnant. Estimated Date of Delivery: 03/03/21    Prenatal care site: [facility]   Chief Complaint: decreased fetal movement   [name] presents today d/t decreased fetal movement this morning.  She states that baby usually moves more at night. She has not felt a lot of movement since this morning.  She has tried drinking a Pepsi, eating a meal, and has not felt 10 kicks.     S: Resting comfortably. no CTX, no VB.no LOF.  Started to feel fetal upon arrival to triage.     Maternal Medical History:  Past Medical Hx:  has a past medical history of Elevated blood pressure reading, GERD (gastroesophageal reflux disease), and Loose body in knee (09/2012).  Past Surgical Hx:  has a past surgical history that includes Knee arthroscopy (03/07/2009; and 2012); Knee arthroscopy (10/13/2012); and Cholecystectomy (N/A, 10/23/2016).   Allergies  Allergen Reactions  . Flector [Diclofenac Epolamine] Itching  . Chlorhexidine Gluconate Rash    Prior to Admission medications   Medication Sig Start Date End Date Taking? Authorizing Provider  acetaminophen (TYLENOL) 500 MG tablet Take 1,000 mg by mouth every 6 (six) hours as needed (for pain.).    [provider]  HYDROcodone-acetaminophen (NORCO) 5-325 MG tablet Take 1-2 tablets by mouth every 4 (four) hours as needed for moderate pain. 10/23/16   Nadeen Landau, MD  NECON 1/50, 28, 1-50 MG-MCG tablet Take 1 tablet by mouth daily. 09/06/16   [provider]  pantoprazole (PROTONIX) 40 MG tablet Take 40 mg by mouth every morning.  07/27/16   [provider]  RABEprazole (ACIPHEX) 20 MG tablet Take 20 mg by mouth daily. 09/29/16   [provider]  sertraline (ZOLOFT) 100 MG tablet Take 100 mg by mouth every morning.  09/28/16   [provider]     Social History: She  reports that she has been smoking  cigarettes. She has never used smokeless tobacco. She reports previous alcohol use. She reports that she does not use drugs.  Family History: Family history non-contributory, no history of gyn cancers  Review of Systems: A full review of systems was performed and negative except as noted in the HPI.     O:  BP 121/81 (BP Location: Left Arm)   Pulse 100   Resp 16   Ht 5\' 5"  (1.651 m)   Wt 88 kg   BMI 32.28 kg/m  No results found for this or any previous visit (from the past 48 hour(s)).   Constitutional: NAD, AAOx3  HE/ENT: extraocular movements grossly intact, moist mucous membranes CV: RRR PULM: nl respiratory effort, CTABL     Abd: gravid, non-tender, non-distended, soft      Ext: Non-tender, Nonedmeatous   Psych: mood appropriate, speech normal Pelvic: deferred   NST: Baseline: 140 bpm Variability: moderate Accelerations present x >2 Decelerations absent Time   A/P: 30 y.o. [redacted]w[redacted]d with decreased fetal movement    Now feeling fetal movement   Fetal Wellbeing: Reassuring Cat 1 tracing.  Reactive NST   D/c home stable, precautions reviewed, follow-up as scheduled this Thursday.    ----- Sunday, CNM Certified Nurse Midwife [facility]

## 2021-01-23 DIAGNOSIS — E039 Hypothyroidism, unspecified: Secondary | ICD-10-CM | POA: Diagnosis not present

## 2021-01-29 DIAGNOSIS — E039 Hypothyroidism, unspecified: Secondary | ICD-10-CM | POA: Diagnosis not present

## 2021-02-03 DIAGNOSIS — Z114 Encounter for screening for human immunodeficiency virus [HIV]: Secondary | ICD-10-CM | POA: Diagnosis not present

## 2021-02-03 DIAGNOSIS — Z3403 Encounter for supervision of normal first pregnancy, third trimester: Secondary | ICD-10-CM | POA: Diagnosis not present

## 2021-02-03 DIAGNOSIS — Z113 Encounter for screening for infections with a predominantly sexual mode of transmission: Secondary | ICD-10-CM | POA: Diagnosis not present

## 2021-02-03 DIAGNOSIS — O98513 Other viral diseases complicating pregnancy, third trimester: Secondary | ICD-10-CM | POA: Diagnosis not present

## 2021-02-03 DIAGNOSIS — Z8616 Personal history of COVID-19: Secondary | ICD-10-CM | POA: Diagnosis not present

## 2021-02-06 ENCOUNTER — Other Ambulatory Visit: Payer: Self-pay | Admitting: Obstetrics and Gynecology

## 2021-02-06 DIAGNOSIS — Z20822 Contact with and (suspected) exposure to covid-19: Secondary | ICD-10-CM

## 2021-02-06 DIAGNOSIS — E039 Hypothyroidism, unspecified: Secondary | ICD-10-CM

## 2021-02-11 ENCOUNTER — Other Ambulatory Visit: Payer: Self-pay | Admitting: Obstetrics and Gynecology

## 2021-02-11 DIAGNOSIS — O98513 Other viral diseases complicating pregnancy, third trimester: Secondary | ICD-10-CM | POA: Diagnosis not present

## 2021-02-11 DIAGNOSIS — Z8616 Personal history of COVID-19: Secondary | ICD-10-CM | POA: Diagnosis not present

## 2021-02-19 ENCOUNTER — Ambulatory Visit: Admission: RE | Admit: 2021-02-19 | Payer: Medicaid Other | Source: Ambulatory Visit

## 2021-02-19 DIAGNOSIS — O09893 Supervision of other high risk pregnancies, third trimester: Secondary | ICD-10-CM | POA: Diagnosis not present

## 2021-02-19 DIAGNOSIS — Z8616 Personal history of COVID-19: Secondary | ICD-10-CM | POA: Diagnosis not present

## 2021-02-19 DIAGNOSIS — O98513 Other viral diseases complicating pregnancy, third trimester: Secondary | ICD-10-CM | POA: Diagnosis not present

## 2021-02-24 ENCOUNTER — Telehealth: Payer: Self-pay

## 2021-02-24 NOTE — Telephone Encounter (Signed)
I called [name] to discuss the IOL that was scheduled for today.  I left a message to either call L&D back and speak with one of the nurses or to call [facility] back after 1:30 and speak with [facility] CMA.    Margaretmary Eddy, CNM

## 2021-02-26 DIAGNOSIS — O99891 Other specified diseases and conditions complicating pregnancy: Secondary | ICD-10-CM | POA: Diagnosis not present

## 2021-02-26 DIAGNOSIS — Z8616 Personal history of COVID-19: Secondary | ICD-10-CM | POA: Diagnosis not present

## 2021-03-03 DIAGNOSIS — O99891 Other specified diseases and conditions complicating pregnancy: Secondary | ICD-10-CM | POA: Diagnosis not present

## 2021-03-03 DIAGNOSIS — Z8616 Personal history of COVID-19: Secondary | ICD-10-CM | POA: Diagnosis not present

## 2021-03-05 ENCOUNTER — Encounter: Payer: Self-pay | Admitting: Obstetrics and Gynecology

## 2021-03-05 ENCOUNTER — Inpatient Hospital Stay
Admission: RE | Admit: 2021-03-05 | Discharge: 2021-03-06 | DRG: 806 | Disposition: A | Discharge status: Home / Self Care | Payer: Medicaid Other | Attending: Obstetrics and Gynecology | Admitting: Obstetrics and Gynecology

## 2021-03-05 ENCOUNTER — Other Ambulatory Visit: Payer: Self-pay

## 2021-03-05 DIAGNOSIS — O99334 Smoking (tobacco) complicating childbirth: Secondary | ICD-10-CM | POA: Diagnosis present

## 2021-03-05 DIAGNOSIS — D62 Acute posthemorrhagic anemia: Secondary | ICD-10-CM | POA: Diagnosis not present

## 2021-03-05 DIAGNOSIS — Z20822 Contact with and (suspected) exposure to covid-19: Secondary | ICD-10-CM | POA: Diagnosis present

## 2021-03-05 DIAGNOSIS — O480 Post-term pregnancy: Secondary | ICD-10-CM | POA: Diagnosis not present

## 2021-03-05 DIAGNOSIS — O26893 Other specified pregnancy related conditions, third trimester: Secondary | ICD-10-CM | POA: Diagnosis present

## 2021-03-05 DIAGNOSIS — Z8616 Personal history of COVID-19: Secondary | ICD-10-CM | POA: Diagnosis not present

## 2021-03-05 DIAGNOSIS — Z23 Encounter for immunization: Secondary | ICD-10-CM | POA: Diagnosis not present

## 2021-03-05 DIAGNOSIS — O9081 Anemia of the puerperium: Secondary | ICD-10-CM | POA: Diagnosis not present

## 2021-03-05 DIAGNOSIS — Z3490 Encounter for supervision of normal pregnancy, unspecified, unspecified trimester: Secondary | ICD-10-CM | POA: Diagnosis present

## 2021-03-05 DIAGNOSIS — F17210 Nicotine dependence, cigarettes, uncomplicated: Secondary | ICD-10-CM | POA: Diagnosis present

## 2021-03-05 DIAGNOSIS — Z3A40 40 weeks gestation of pregnancy: Secondary | ICD-10-CM | POA: Diagnosis not present

## 2021-03-05 LAB — CBC
HCT: 33.1 % — ABNORMAL LOW (ref 36.0–46.0)
Hemoglobin: 10.7 g/dL — ABNORMAL LOW (ref 12.0–15.0)
MCH: 25.4 pg — ABNORMAL LOW (ref 26.0–34.0)
MCHC: 32.3 g/dL (ref 30.0–36.0)
MCV: 78.4 fL — ABNORMAL LOW (ref 80.0–100.0)
Platelets: 475 10*3/uL — ABNORMAL HIGH (ref 150–400)
RBC: 4.22 MIL/uL (ref 3.87–5.11)
RDW: 15.3 % (ref 11.5–15.5)
WBC: 10.8 10*3/uL — ABNORMAL HIGH (ref 4.0–10.5)
nRBC: 0 % (ref 0.0–0.2)

## 2021-03-05 LAB — RESP PANEL BY RT-PCR (FLU A&B, COVID) ARPGX2
Influenza A by PCR: NEGATIVE
Influenza B by PCR: NEGATIVE
SARS Coronavirus 2 by RT PCR: NEGATIVE

## 2021-03-05 LAB — ABO/RH: ABO/RH(D): A POS

## 2021-03-05 LAB — PROTEIN / CREATININE RATIO, URINE
Creatinine, Urine: 98 mg/dL
Protein Creatinine Ratio: 0.12 mg/mg{Cre} (ref 0.00–0.15)
Total Protein, Urine: 12 mg/dL

## 2021-03-05 LAB — TYPE AND SCREEN
ABO/RH(D): A POS
Antibody Screen: NEGATIVE

## 2021-03-05 MED ORDER — ACETAMINOPHEN 325 MG PO TABS: 650.0000 mg | ORAL_TABLET | ORAL | Status: DC | PRN

## 2021-03-05 MED ORDER — OXYTOCIN-SODIUM CHLORIDE 30-0.9 UT/500ML-% IV SOLN
2.5000 [IU]/h | INTRAVENOUS | Status: DC
  Administered 2021-03-05: 2.5 [IU]/h via INTRAVENOUS

## 2021-03-05 MED ORDER — LIDOCAINE HCL (PF) 1 % IJ SOLN
30.0000 mL | INTRAMUSCULAR | Status: AC | PRN
  Administered 2021-03-05: 30 mL via SUBCUTANEOUS

## 2021-03-05 MED ORDER — TERBUTALINE SULFATE 1 MG/ML IJ SOLN
0.2500 mg | Freq: Once | INTRAMUSCULAR | Status: AC | PRN
  Administered 2021-03-05: 0.25 mg via SUBCUTANEOUS

## 2021-03-05 MED ORDER — OXYCODONE HCL 5 MG PO TABS
5.0000 mg | ORAL_TABLET | Freq: Four times a day (QID) | ORAL | Status: DC | PRN
Start: 2021-03-05 — End: 2021-03-07

## 2021-03-05 MED ORDER — OXYTOCIN-SODIUM CHLORIDE 30-0.9 UT/500ML-% IV SOLN: 1.0000 m[IU]/min | INTRAVENOUS | Status: DC

## 2021-03-05 MED ORDER — FERROUS SULFATE 325 (65 FE) MG PO TABS
325.0000 mg | ORAL_TABLET | Freq: Two times a day (BID) | ORAL | Status: DC
  Administered 2021-03-05 – 2021-03-06 (×3): 325 mg via ORAL
  Filled 2021-03-05 (×3): qty 1

## 2021-03-05 MED ORDER — DIPHENHYDRAMINE HCL 25 MG PO CAPS: 25.0000 mg | ORAL_CAPSULE | Freq: Four times a day (QID) | ORAL | Status: DC | PRN

## 2021-03-05 MED ORDER — ONDANSETRON HCL 4 MG/2ML IJ SOLN: 4.0000 mg | Freq: Four times a day (QID) | INTRAMUSCULAR | Status: DC | PRN

## 2021-03-05 MED ORDER — SIMETHICONE 80 MG PO CHEW: 80.0000 mg | CHEWABLE_TABLET | ORAL | Status: DC | PRN

## 2021-03-05 MED ORDER — SOD CITRATE-CITRIC ACID 500-334 MG/5ML PO SOLN: 30.0000 mL | ORAL | Status: DC | PRN

## 2021-03-05 MED ORDER — OXYTOCIN BOLUS FROM INFUSION
333.0000 mL | Freq: Once | INTRAVENOUS | Status: AC
  Administered 2021-03-05: 333 mL via INTRAVENOUS

## 2021-03-05 MED ORDER — BUTORPHANOL TARTRATE 1 MG/ML IJ SOLN
1.0000 mg | INTRAMUSCULAR | Status: DC | PRN
  Administered 2021-03-05: 1 mg via INTRAVENOUS
  Filled 2021-03-05: qty 1

## 2021-03-05 MED ORDER — DIBUCAINE (PERIANAL) 1 % EX OINT
1.0000 "application " | TOPICAL_OINTMENT | CUTANEOUS | Status: DC | PRN
  Administered 2021-03-05: 1 via RECTAL
  Filled 2021-03-05: qty 28

## 2021-03-05 MED ORDER — LACTATED RINGERS IV SOLN: 500.0000 mL | INTRAVENOUS | Status: DC | PRN

## 2021-03-05 MED ORDER — LACTATED RINGERS IV SOLN: INTRAVENOUS | Status: DC

## 2021-03-05 MED ORDER — IBUPROFEN 600 MG PO TABS
600.0000 mg | ORAL_TABLET | Freq: Four times a day (QID) | ORAL | Status: DC
  Administered 2021-03-05 – 2021-03-06 (×3): 600 mg via ORAL
  Filled 2021-03-05 (×3): qty 1

## 2021-03-05 MED ORDER — WITCH HAZEL-GLYCERIN EX PADS
1.0000 "application " | MEDICATED_PAD | CUTANEOUS | Status: DC | PRN
  Administered 2021-03-05: 1 via TOPICAL
  Filled 2021-03-05: qty 100

## 2021-03-05 MED ORDER — MAGNESIUM HYDROXIDE 400 MG/5ML PO SUSP
30.0000 mL | ORAL | Status: DC | PRN
  Filled 2021-03-05: qty 30

## 2021-03-05 MED ORDER — PRENATAL MULTIVITAMIN CH
1.0000 | ORAL_TABLET | Freq: Every day | ORAL | Status: DC
  Administered 2021-03-06: 1 via ORAL
  Filled 2021-03-05: qty 1

## 2021-03-05 MED ORDER — SENNOSIDES-DOCUSATE SODIUM 8.6-50 MG PO TABS
2.0000 | ORAL_TABLET | ORAL | Status: DC
  Administered 2021-03-05 – 2021-03-06 (×2): 2 via ORAL
  Filled 2021-03-05 (×2): qty 2

## 2021-03-05 MED ORDER — COCONUT OIL OIL
1.0000 "application " | TOPICAL_OIL | Status: DC | PRN
  Filled 2021-03-05: qty 120

## 2021-03-05 MED ORDER — BENZOCAINE-MENTHOL 20-0.5 % EX AERO
1.0000 "application " | INHALATION_SPRAY | CUTANEOUS | Status: DC | PRN
  Administered 2021-03-05: 1 via TOPICAL
  Filled 2021-03-05: qty 56

## 2021-03-05 MED ORDER — MEASLES, MUMPS & RUBELLA VAC IJ SOLR
0.5000 mL | Freq: Once | INTRAMUSCULAR | Status: AC
  Administered 2021-03-06: 0.5 mL via SUBCUTANEOUS
  Filled 2021-03-05 (×2): qty 0.5

## 2021-03-05 MED ORDER — TERBUTALINE SULFATE 1 MG/ML IJ SOLN: 0.2500 mg | Freq: Once | INTRAMUSCULAR | Status: DC | PRN

## 2021-03-05 MED ORDER — CALCIUM CARBONATE ANTACID 500 MG PO CHEW
1.0000 | CHEWABLE_TABLET | Freq: Three times a day (TID) | ORAL | Status: DC | PRN
  Administered 2021-03-05: 400 mg via ORAL
  Filled 2021-03-05: qty 2

## 2021-03-05 MED ORDER — OXYTOCIN-SODIUM CHLORIDE 30-0.9 UT/500ML-% IV SOLN
INTRAVENOUS | Status: AC
  Filled 2021-03-05: qty 500

## 2021-03-05 MED ORDER — ZOLPIDEM TARTRATE 5 MG PO TABS: 5.0000 mg | ORAL_TABLET | Freq: Every evening | ORAL | Status: DC | PRN

## 2021-03-05 MED ORDER — ONDANSETRON HCL 4 MG PO TABS: 4.0000 mg | ORAL_TABLET | ORAL | Status: DC | PRN

## 2021-03-05 MED ORDER — MISOPROSTOL 25 MCG QUARTER TABLET
25.0000 ug | ORAL_TABLET | ORAL | Status: DC | PRN
  Administered 2021-03-05: 25 ug via VAGINAL
  Filled 2021-03-05: qty 1

## 2021-03-05 MED ORDER — ONDANSETRON HCL 4 MG/2ML IJ SOLN: 4.0000 mg | INTRAMUSCULAR | Status: DC | PRN

## 2021-03-05 MED ORDER — MEASLES, MUMPS & RUBELLA VAC IJ SOLR: 0.5000 mL | Freq: Once | INTRAMUSCULAR | Status: DC

## 2021-03-05 MED ORDER — MISOPROSTOL 25 MCG QUARTER TABLET
25.0000 ug | ORAL_TABLET | Freq: Once | ORAL | Status: AC
  Administered 2021-03-05: 25 ug via ORAL
  Filled 2021-03-05: qty 1

## 2021-03-05 NOTE — Lactation Note (Signed)
This note was copied from a baby's chart. Lactation Consultation Note  Patient Name: [name] [id number] Date: 03/05/2021 Reason for consult: L&D Initial assessment;1st time breastfeeding;Primapara;Term Age:28 hours  Initial lactation visit in L&D. Mom is G1P1, IOL this morning. SVD <1 hr prior to first feeding.  Baby already in football hold with support pillows on R breast by Transition RN. RN attempted latched. LC at bedside: sandwiched breast tissue- 3 attempts made before baby grasped the breast. Baby on/off breast 3-4 times and then successfully grasped and maintained latch.   Baby at breast for 17 minutes total.   During feed mom was educated on positioning, alignment, flanged upper and lower lips, strong tugs with no pain/discomfort. Educated on hunger cues, feeding with early cues, benefits of skin to skin, normal newborn feeding patterns. Encouraged 8-12 attempts in first 24 hours, taught hand expression and encouraged hand expression between feedings and if baby is sleepy. Mom had no further questions at this time.  Maternal Data Has patient been taught Hand Expression?: Yes Does the patient have breastfeeding experience prior to this delivery?: No (first time mom)  Feeding Mother's Current Feeding Choice: Breast Milk  LATCH Score Latch: Repeated attempts needed to sustain latch, nipple held in mouth throughout feeding, stimulation needed to elicit sucking reflex.  Audible Swallowing: A few with stimulation  Type of Nipple: Everted at rest and after stimulation (shorter nipple)  Comfort (Breast/Nipple): Soft / non-tender  Hold (Positioning): Full assist, staff holds infant at breast  LATCH Score: 6   Lactation Tools Discussed/Used    Interventions Interventions: Breast feeding basics reviewed;Assisted with latch;Skin to skin;Hand express;Adjust position;Support pillows;Breast compression;Education  Discharge    Consult Status Consult Status:  Follow-up Date: 03/05/21 Follow-up type: In-patient    Danford Bad 03/05/2021, 4:40 PM

## 2021-03-05 NOTE — Progress Notes (Signed)
Patient ID: [name], female   DOB: 11/19/1990, 30 y.o.   MRN: [id number] Total laceration length for second degree perineal laceration and bilateral labial laceration was 9 cm in total length

## 2021-03-05 NOTE — Progress Notes (Signed)
  Patient ID: [name], female   DOB: [birth year], 30 y.o.   MRN: [id number] AROM clear , scant fliud: cx : 6 cm / c/ 0  FSE and IUPC placed  Cat 1Fetal monitoring  Inadequate ctx pattern - add Pitocin Anticipate SVD

## 2021-03-05 NOTE — Progress Notes (Signed)
Patient ID: [name], female   DOB: 08-Jun-1991, 30 y.o.   MRN: [id number] CTSP for fetal decel .HR for 3 minutes 40-60 and additional 6 minutes with slow recovery  IVF bolus , SQ terbutaline given  Cx: 9 cm rim on right /+1    At 1432 fetal rate 115 , good variability  A: cat 2 fetal monitoring resolved for now  P: continue closed observation  Pt is aware if prolong decel there may be a need for LTCS

## 2021-03-05 NOTE — H&P (Signed)
[name] is a 30 y.o. female presenting for IOL 40+2 weeks elective  H/o covid inpregnancy , anemia and mental health issues  OB History    Gravida  1   Para      Term      Preterm      AB      Living        SAB      IAB      Ectopic      Multiple      Live Births             Past Medical History:  Diagnosis Date  . Elevated blood pressure reading   . GERD (gastroesophageal reflux disease)   . Loose body in knee 09/2012   right   Past Surgical History:  Procedure Laterality Date  . CHOLECYSTECTOMY N/A 10/23/2016   Procedure: LAPAROSCOPIC CHOLECYSTECTOMY WITH INTRAOPERATIVE CHOLANGIOGRAM;  Surgeon: Nadeen Landau, MD;  Location: [facility] ORS;  Service: General;  Laterality: N/A;  . KNEE ARTHROSCOPY  03/07/2009; and 2012   right  . KNEE ARTHROSCOPY  10/13/2012   Procedure: ARTHROSCOPY KNEE;  Surgeon: Loreta Ave, MD;  Location: [facility];  Service: Orthopedics;  Laterality: Right;  RIGHT KNEE ARTHROSCOPY  REMOVAL LOOSE BODIES   Family History: family history is not on file. Social History:  reports that she has been smoking cigarettes. She has never used smokeless tobacco. She reports previous alcohol use. She reports that she does not use drugs.     Maternal Diabetes: No Genetic Screening: Normal Maternal Ultrasounds/Referrals: Normal Fetal Ultrasounds or other Referrals:  None Maternal Substance Abuse:  No Significant Maternal Medications:  None Significant Maternal Lab Results:  Group B Strep negative Other Comments:  None  Review of Systems History Dilation: 2 Effacement (%): 80 Station: -2,-1 Exam by:: Elyn Peers RN Blood pressure 137/88, pulse 82, temperature 98.5 F (36.9 C), temperature source Oral. Exam Physical Exam  Prenatal labs: ABO, Rh: --/--/A POS Performed at [facility], [address] [place], [place] [address]  [phone]) Antibody: NEG (05/11 0846) Rubella:  non immune RPR:    nr HBsAg:   neg HIV:   neg GBS:   neg  Assessment/Plan: cytotec admission     [name] 03/05/2021, 12:46 PM

## 2021-03-05 NOTE — Discharge Summary (Signed)
Obstetrical Discharge Summary  Patient Name: [name] DOB: Jun 26, 1991 MRN: [id number]  Date of Admission: 03/05/2021 Date of Delivery: 03/05/21 Delivered by: Huel Cote MD Date of Discharge: 03/06/2021 Primary OB: [facility]  LMP08/2/21 [facility] Estimated Date of Delivery: 03/03/21 Gestational Age at Delivery: [redacted]w[redacted]d  Antepartum complications: anemia , covid in pregnancy , mental health issues  Admitting Diagnosis:  Secondary Diagnosis: Patient Active Problem List   Diagnosis Date Noted  . Encounter for induction of labor 03/05/2021  . Decreased fetal movement affecting management of pregnancy in third trimester 01/01/2021    Augmentation: AROM and Cytotec Complications: None Intrapartum complications/course:  Date of Delivery: 03/05/2021 Delivered By: THuel CoteMD Delivery Type: spontaneous vaginal delivery Anesthesia: epidural Placenta: spontaneous Laceration: Second degree with bilateral labia with repair  Episiotomy: none Newborn Data: Live born female born 03/05/21 _0  Birth Weight:  7lbs 3oz, 3260g APGAR: 963 10  Newborn Delivery   Birth date/time: 03/05/2021 15:18:00 Delivery type: Vaginal, Spontaneous     Postpartum Procedures: none   Edinburgh:  Edinburgh Postnatal Depression Scale Screening Tool 03/06/2021  I have been able to laugh and see the funny side of things. 0  I have looked forward with enjoyment to things. 0  I have blamed myself unnecessarily when things went wrong. 0  I have been anxious or worried for no good reason. 0  I have felt scared or panicky for no good reason. 0  Things have been getting on top of me. 0  I have been so unhappy that I have had difficulty sleeping. 0  I have felt sad or miserable. 0  I have been so unhappy that I have been crying. 0  The thought of harming myself has occurred to me. 0  Edinburgh Postnatal Depression Scale Total 0    Post partum course:  Patient had an uncomplicated postpartum course.   By time of discharge on PPD#1, her pain was controlled on oral pain medications; she had appropriate lochia and was ambulating, voiding without difficulty and tolerating regular diet.  She was deemed stable for discharge to home.    Discharge Physical Exam:  BP 118/73 (BP Location: Right Arm)   Pulse 63   Temp 98 F (36.7 C) (Oral)   Resp 20   SpO2 100%   Breastfeeding Unknown   General: NAD CV: RRR Pulm: CTABL, nl effort ABD: s/nd/nt, fundus firm and below the umbilicus Lochia: moderate Perineum: minimal edema, repair well approximated  DVT Evaluation: LE non-ttp, no evidence of DVT on exam.  Hemoglobin  Date Value Ref Range Status  03/06/2021 8.6 (L) 12.0 - 15.0 g/dL Final   HCT  Date Value Ref Range Status  03/06/2021 27.3 (L) 36.0 - 46.0 % Final     Disposition: stable, discharge to home. Baby Feeding: breastmilk Baby Disposition: home with mom  Rh Immune globulin given: Rh pos  Rubella vaccine given: Non-immune - MMR offered prior to discharge  Tdap vaccine given in AP or PP setting: declined  Flu vaccine given in AP or PP setting: declined   Contraception: Considering non-hormonal methods, partner considering vasectomy   Prenatal Labs:  ABO, Rh: --/--/A POS [facility] [address] [place] [address] ([phone]) Antibody: NEG (05/11 0846) Rubella:  non immune RPR:   nr HBsAg:   neg HIV:   neg GBS:   neg   Plan:  Erica NUTTERwas discharged to home in good condition. Follow-up appointment with delivering provider  in 6 weeks.  Discharge Medications: Allergies as of 03/06/2021      Reactions   Flector [diclofenac Epolamine] Itching   Chlorhexidine Gluconate Rash      Medication List    STOP taking these medications   HYDROcodone-acetaminophen 5-325 MG tablet Commonly known as: Norco   Necon 1/50 (28) 1-50 MG-MCG tablet Generic drug: Norethindrone-Mestranol   RABEprazole 20 MG tablet Commonly known as:  ACIPHEX     TAKE these medications   acetaminophen 500 MG tablet Commonly known as: TYLENOL Take 1,000 mg by mouth every 6 (six) hours as needed (for pain.).   pantoprazole 40 MG tablet Commonly known as: PROTONIX Take 40 mg by mouth every morning.   sertraline 100 MG tablet Commonly known as: ZOLOFT Take 100 mg by mouth every morning.         Signed:  Drinda Butts, CNM Certified Nurse Midwife [facility]

## 2021-03-06 LAB — CBC
HCT: 27.3 % — ABNORMAL LOW (ref 36.0–46.0)
Hemoglobin: 8.6 g/dL — ABNORMAL LOW (ref 12.0–15.0)
MCH: 25.2 pg — ABNORMAL LOW (ref 26.0–34.0)
MCHC: 31.5 g/dL (ref 30.0–36.0)
MCV: 80.1 fL (ref 80.0–100.0)
Platelets: 371 10*3/uL (ref 150–400)
RBC: 3.41 MIL/uL — ABNORMAL LOW (ref 3.87–5.11)
RDW: 15.3 % (ref 11.5–15.5)
WBC: 14.8 10*3/uL — ABNORMAL HIGH (ref 4.0–10.5)
nRBC: 0 % (ref 0.0–0.2)

## 2021-03-06 LAB — RPR: RPR Ser Ql: NONREACTIVE

## 2021-03-06 NOTE — Discharge Instructions (Signed)
Postpartum Care After Vaginal Delivery The following information offers guidance about how to care for yourself from the time you deliver your baby to 6-12 weeks after delivery (postpartum period). If you have problems or questions, contact your health care provider for more specific instructions. Follow these instructions at home: Vaginal bleeding  It is normal to have vaginal bleeding (lochia) after delivery. Wear a sanitary pad for bleeding and discharge. ? During the first week after delivery, the amount and appearance of lochia is often similar to a menstrual period. ? Over the next few weeks, it will gradually decrease to a dry, yellow-brown discharge. ? For most women, lochia stops completely by 4-6 weeks after delivery, but can vary.  Change your sanitary pads frequently. Watch for any changes in your flow, such as: ? A sudden increase in volume. ? A change in color. ? Large blood clots.  If you pass a blood clot from your vagina, save it and call your health care provider. Do not flush blood clots down the toilet before talking with your health care provider.  Do not use tampons or douches until your health care provider approves.  If you are not breastfeeding, your period should return 6-8 weeks after delivery. If you are feeding your baby breast milk only, your period may not return until you stop breastfeeding. Perineal care  Keep the area between the vagina and the anus (perineum) clean and dry. Use medicated pads and pain-relieving sprays and creams as directed.  If you had a surgical cut in the perineum (episiotomy) or a tear, check the area for signs of infection until you are healed. Check for: ? More redness, swelling, or pain. ? Fluid or blood coming from the cut or tear. ? Warmth. ? Pus or a bad smell.  You may be given a squirt bottle to use instead of wiping to clean the perineum area after you use the bathroom. Pat the area gently to dry it.  To relieve pain  caused by an episiotomy, a tear, or swollen veins in the anus (hemorrhoids), take a warm sitz bath 2-3 times a day. In a sitz bath, the warm water should only come up to your hips and cover your buttocks.   Breast care  In the first few days after delivery, your breasts may feel heavy, full, and uncomfortable (breast engorgement). Milk may also leak from your breasts. Ask your health care provider about ways to help relieve the discomfort.  If you are breastfeeding: ? Wear a bra that supports your breasts and fits well. Use breast pads to absorb milk that leaks. ? Keep your nipples clean and dry. Apply creams and ointments as told. ? You may have uterine contractions every time you breastfeed for up to several weeks after delivery. This helps your uterus return to its normal size. ? If you have any problems with breastfeeding, notify your health care provider or lactation consultant.  If you are not breastfeeding: ? Avoid touching your breasts. Do not squeeze out (express) milk. Doing this can make your breasts produce more milk. ? Wear a good-fitting bra and use cold packs to help with swelling. Intimacy and sexuality  Ask your health care provider when you can engage in sexual activity. This may depend upon: ? Your risk of infection. ? How fast you are healing. ? Your comfort and desire to engage in sexual activity.  You are able to get pregnant after delivery, even if you have not had your period. Talk with   your health care provider about methods of birth control (contraception) or family planning if you desire future pregnancies. Medicines  Take over-the-counter and prescription medicines only as told by your health care provider.  Take an over-the-counter stool softener to help ease bowel movements as told by your health care provider.  If you were prescribed an antibiotic medicine, take it as told by your health care provider. Do not stop taking the antibiotic even if you start to  feel better.  Review all previous and current prescriptions to check for possible transfer into breast milk. Activity  Gradually return to your normal activities as told by your health care provider.  Rest as much as possible. Nap while your baby is sleeping. Eating and drinking  Drink enough fluid to keep your urine pale yellow.  To help prevent or relieve constipation, eat high-fiber foods every day.  Choose healthy eating to support breastfeeding or weight loss goals.  Take your prenatal vitamins until your health care provider tells you to stop.   General tips/recommendations  Do not use any products that contain nicotine or tobacco. These products include cigarettes, chewing tobacco, and vaping devices, such as e-cigarettes. If you need help quitting, ask your health care provider.  Do not drink alcohol, especially if you are breastfeeding.  Do not take medications or drugs that are not prescribed to you, especially if you are breastfeeding.  Visit your health care provider for a postpartum checkup within the first 3-6 weeks after delivery.  Complete a comprehensive postpartum visit no later than 12 weeks after delivery.  Keep all follow-up visits for you and your baby. Contact a health care provider if:  You feel unusually sad or worried.  Your breasts become red, painful, or hard.  You have a fever or other signs of an infection.  You have bleeding that is soaking through one pad an hour or you have blood clots.  You have a severe headache that doesn't go away or you have vision changes.  You have nausea and vomiting and are unable to eat or drink anything for 24 hours. Get help right away if:  You have chest pain or difficulty breathing.  You have sudden, severe leg pain.  You faint or have a seizure.  You have thoughts about hurting yourself or your baby. If you ever feel like you may hurt yourself or others, or have thoughts about taking your own life,  get help right away. Go to your nearest emergency department or:  Call your local emergency services (911 in the [place]  The National Suicide Prevention Lifeline at 1-800-273-8255. This suicide crisis helpline is open 24 hours a day.  Text the Crisis Text Line at 741741 (in the [place] Summary  The period of time after you deliver your newborn up to 6-12 weeks after delivery is called the postpartum period.  Keep all follow-up visits for you and your baby.  Review all previous and current prescriptions to check for possible transfer into breast milk.  Contact a health care provider if you feel unusually sad or worried during the postpartum period. This information is not intended to replace advice given to you by your health care provider. Make sure you discuss any questions you have with your health care provider. Document Revised: 06/27/2020 Document Reviewed: 06/27/2020 Elsevier Patient Education  2021 Elsevier Inc.   Postpartum Baby Blues The postpartum period begins right after the birth of a baby. During this time, there is often joy and excitement. It is   also a time of many changes in the life of the parents. A mother may feel happy one minute and sad or stressed the next. These feelings of sadness, called the baby blues, usually happen in the period right after the baby is born and go away within a week or two. What are the causes? The exact cause of this condition is not known. Changes in hormone levels after childbirth are believed to trigger some of the symptoms. Other factors that can play a role in these mood changes include:  Lack of sleep.  Stressful life events, such as financial problems, caring for a loved one, or death of a loved one.  Genetics. What are the signs or symptoms? Symptoms of this condition include:  Changes in mood, such as going from extreme happiness to sadness.  A decrease in concentration.  Difficulty sleeping.  Crying spells and  tearfulness.  Loss of appetite.  Irritability.  Anxiety. If these symptoms last for more than 2 weeks or become more severe, you may have postpartum depression. How is this diagnosed? This condition is diagnosed based on an evaluation of your symptoms. Your health care provider may use a screening tool that includes a list of questions to help identify a person with the baby blues or postpartum depression. How is this treated? The baby blues usually go away on their own in 1-2 weeks. Social support is often what is needed. You will be encouraged to get adequate sleep and rest. Follow these instructions at home: Lifestyle  Get as much rest as you can. Take a nap when the baby sleeps.  Exercise regularly as told by your health care provider. Some women find yoga and walking to be helpful.  Eat a balanced and nourishing diet. This includes plenty of fruits and vegetables, whole grains, and lean proteins.  Do little things that you enjoy. Take a bubble bath, read your favorite magazine, or listen to your favorite music.  Avoid alcohol.  Ask for help with household chores, cooking, grocery shopping, or running errands. Do not try to do everything yourself. Consider hiring a postpartum doula to help. This is a professional who specializes in providing support to new mothers.  Try not to make any major life changes during pregnancy or right after giving birth. This can add stress.      General instructions  Talk to people close to you about how you are feeling. Get support from your partner, family members, friends, or other new moms. You may want to join a support group.  Find ways to manage stress. This may include: ? Writing your thoughts and feelings in a journal. ? Spending time outside. ? Spending time with people who make you laugh.  Try to stay positive in how you think. Think about the things you are grateful for.  Take over-the-counter and prescription medicines only as  told by your health care provider.  Let your health care provider know if you have any concerns.  Keep all postpartum visits. This is important. Contact a health care provider if:  Your baby blues do not go away after 2 weeks. Get help right away if:  You have thoughts of taking your own life (suicidal thoughts), or of harming your baby or someone else.  You see or hear things that are not there (hallucinations). If you ever feel like you may hurt yourself or others, or have thoughts about taking your own life, get help right away. Go to your nearest emergency department or:    Call your local emergency services (911 in the [place]  Call a suicide crisis helpline, such as the National Suicide Prevention Lifeline, at 1-800-273-8255. This is open 24 hours a day in the [place]  Text the Crisis Text Line at 741741 (in the [place] Summary  After giving birth, you may feel happy one minute and sad or stressed the next. Feelings of sadness that happen right after the baby is born and go away after a week or two are called the baby blues.  You can manage the baby blues by getting enough rest, eating a healthy diet, exercising, spending time with supportive people, and finding ways to manage stress.  If feelings of sadness and stress last longer than 2 weeks or get in the way of caring for your baby, talk with your health care provider. This may mean you have postpartum depression. This information is not intended to replace advice given to you by your health care provider. Make sure you discuss any questions you have with your health care provider. Document Revised: 04/05/2020 Document Reviewed: 04/05/2020 Elsevier Patient Education  2021 Elsevier Inc.     

## 2021-03-06 NOTE — Progress Notes (Signed)
Postpartum Day  1  Subjective: no complaints, up ad lib, voiding and tolerating PO  Doing well, no concerns. Ambulating without difficulty, pain managed with PO meds, tolerating regular diet, and voiding without difficulty.   No fever/chills, chest pain, shortness of breath, nausea/vomiting, or leg pain. No nipple or breast pain. No headache, visual changes, or RUQ/epigastric pain.  Objective: BP 99/62 (BP Location: Right Arm)   Pulse 66   Temp 98.2 F (36.8 C) (Oral)   Resp 20   SpO2 100%   Breastfeeding Unknown    Physical Exam:  General: alert, cooperative and no distress Breasts: soft/nontender CV: RRR Pulm: nl effort, CTABL Abdomen: soft, non-tender, active bowel sounds Uterine Fundus: firm Perineum: minimal edema, repair well approximated Lochia: appropriate DVT Evaluation: No evidence of DVT seen on physical exam.  Recent Labs    03/05/21 0846 03/06/21 0447  HGB 10.7* 8.6*  HCT 33.1* 27.3*  WBC 10.8* 14.8*  PLT 475* 371    Assessment/Plan: 30 y.o. G1P1001 postpartum day # 1  -Continue routine postpartum care -Lactation consult PRN for breastfeeding   -Acute blood loss anemia - hemodynamically stable and asymptomatic; start PO ferrous sulfate BID with stool softeners  -Immunization status:   needs MMR prior to discharge    Disposition: Desires discharge home today. Discharge pending status of newborn 24 hour testing and feeding progress.    LOS: 1 day   Minda Meo, Mallory Shirk 03/06/2021, 8:53 AM   ----- Drinda Butts  Certified Nurse Midwife [facility]

## 2021-03-06 NOTE — Progress Notes (Signed)
Patient discharged home with infant. Discharge instructions, prescriptions, and follow-up appointment were given to and reviewed with patient. Patient verbalized understanding. Escorted out by auxiliary.  

## 2021-03-06 NOTE — Lactation Note (Signed)
This note was copied from a baby's chart. Lactation Consultation Note  Patient Name: [name] Date: 03/06/2021 Reason for consult: Follow-up assessment;Primapara;Term Age:28 hours  Lactation follow-up. Baby did breastfeed well overnight with RN assistance. RN did supply a nipple shield, baby has fed with and without it.  Mom reports feeling sore this morning- comfort gels and coconut oil given, mom educated on application of both, and not using together. LC reviewed feeding patterns and behaviors for newborns, encouraged feeding with early cues, hand expression between feedings, and spending time skin to skin.  Whiteboard updated with LC name/number. Encouraged to call out with questions and for breastfeeding support.  Maternal Data Has patient been taught Hand Expression?: Yes Does the patient have breastfeeding experience prior to this delivery?: No  Feeding Mother's Current Feeding Choice: Breast Milk  LATCH Score                    Lactation Tools Discussed/Used Tools: Coconut oil;Comfort gels  Interventions Interventions: Breast feeding basics reviewed;Coconut oil;Comfort gels;Education  Discharge    Consult Status Consult Status: Follow-up Date: 03/06/21 Follow-up type: In-patient    Danford Bad 03/06/2021, 9:54 AM

## 2021-03-07 ENCOUNTER — Telehealth: Payer: Self-pay

## 2021-03-07 NOTE — Telephone Encounter (Signed)
Transition Care Management Unsuccessful Follow-up Telephone Call  Date of discharge and from where:  03/06/2021 from [facility]  Attempts:  1st Attempt  Reason for unsuccessful TCM follow-up call:  Left voice message

## 2021-03-10 NOTE — Telephone Encounter (Signed)
Transition Care Management Unsuccessful Follow-up Telephone Call  Date of discharge and from where:  03/06/2021 from [facility]  Attempts:  2nd Attempt  Reason for unsuccessful TCM follow-up call:  Left voice message

## 2021-03-11 NOTE — Telephone Encounter (Signed)
Transition Care Management Unsuccessful Follow-up Telephone Call  Date of discharge and from where:  03/06/2021 from [facility]  Attempts:  3rd Attempt  Reason for unsuccessful TCM follow-up call:  Unable to reach patient

## 2021-03-12 NOTE — H&P (Signed)
[name] is a 30 y.o. female presenting for IOL 40+2 weeks elective  H/o covid inpregnancy , anemia and mental health issues          OB History    Gravida  1   Para      Term      Preterm      AB      Living        SAB      IAB      Ectopic      Multiple      Live Births                 Past Medical History:  Diagnosis Date  . Elevated blood pressure reading   . GERD (gastroesophageal reflux disease)   . Loose body in knee 09/2012   right        Past Surgical History:  Procedure Laterality Date  . CHOLECYSTECTOMY N/A 10/23/2016   Procedure: LAPAROSCOPIC CHOLECYSTECTOMY WITH INTRAOPERATIVE CHOLANGIOGRAM;  Surgeon: Nadeen Landau, MD;  Location: [facility] ORS;  Service: General;  Laterality: N/A;  . KNEE ARTHROSCOPY  03/07/2009; and 2012   right  . KNEE ARTHROSCOPY  10/13/2012   Procedure: ARTHROSCOPY KNEE;  Surgeon: Loreta Ave, MD;  Location:  SURGERY CENTER;  Service: Orthopedics;  Laterality: Right;  RIGHT KNEE ARTHROSCOPY  REMOVAL LOOSE BODIES   Family History: family history is not on file. Social History:  reports that she has been smoking cigarettes. She has never used smokeless tobacco. She reports previous alcohol use. She reports that she does not use drugs.     Maternal Diabetes: No Genetic Screening: Normal Maternal Ultrasounds/Referrals: Normal Fetal Ultrasounds or other Referrals:  None Maternal Substance Abuse:  No Significant Maternal Medications:  None Significant Maternal Lab Results:  Group B Strep negative Other Comments:  None  Review of Systems History Dilation: 2 Effacement (%): 80 Station: -2,-1 Exam by:: Elyn Peers RN Blood pressure 137/88, pulse 82, temperature 98.5 F (36.9 C), temperature source Oral. Exam Physical Exam  Lungs CTA   CV RRR Abd: gravid  Cx: 2cm / 80%/-2 Prenatal labs: ABO, Rh: --/--/A POS Performed at [facility], [address]  [place], [place] [address]  [phone]) Antibody: NEG (05/11 0846) Rubella:  non immune RPR:   nr HBsAg:   neg HIV:   neg GBS:   neg  Assessment/Plan: cytotec admission

## 2021-04-07 DIAGNOSIS — Z3009 Encounter for other general counseling and advice on contraception: Secondary | ICD-10-CM | POA: Diagnosis not present

## 2021-04-07 DIAGNOSIS — Z30011 Encounter for initial prescription of contraceptive pills: Secondary | ICD-10-CM | POA: Diagnosis not present

## 2021-04-07 DIAGNOSIS — Z8639 Personal history of other endocrine, nutritional and metabolic disease: Secondary | ICD-10-CM | POA: Diagnosis not present

## 2021-04-07 DIAGNOSIS — Z124 Encounter for screening for malignant neoplasm of cervix: Secondary | ICD-10-CM | POA: Diagnosis not present

## 2021-07-10 DIAGNOSIS — R7989 Other specified abnormal findings of blood chemistry: Secondary | ICD-10-CM | POA: Diagnosis not present

## 2021-07-10 DIAGNOSIS — R946 Abnormal results of thyroid function studies: Secondary | ICD-10-CM | POA: Diagnosis not present

## 2021-12-02 DIAGNOSIS — F419 Anxiety disorder, unspecified: Secondary | ICD-10-CM | POA: Diagnosis not present

## 2022-04-14 ENCOUNTER — Telehealth: Payer: Self-pay

## 2022-04-14 NOTE — Telephone Encounter (Signed)
Left message to call back if interested in appointment with a PCP. Call back number [phone].

## 2022-05-07 ENCOUNTER — Telehealth: Payer: Self-pay

## 2022-05-07 NOTE — Telephone Encounter (Signed)
Attempted to reach pt. In regard to appointment with PCP. Left message to call back to [phone] if interested.

## 2022-05-12 ENCOUNTER — Encounter: Payer: Self-pay | Admitting: Obstetrics and Gynecology

## 2022-06-30 DIAGNOSIS — F32A Depression, unspecified: Secondary | ICD-10-CM | POA: Diagnosis not present

## 2022-06-30 DIAGNOSIS — Z01411 Encounter for gynecological examination (general) (routine) with abnormal findings: Secondary | ICD-10-CM | POA: Diagnosis not present

## 2022-07-06 DIAGNOSIS — R946 Abnormal results of thyroid function studies: Secondary | ICD-10-CM | POA: Diagnosis not present

## 2022-07-06 DIAGNOSIS — R7989 Other specified abnormal findings of blood chemistry: Secondary | ICD-10-CM | POA: Diagnosis not present

## 2022-11-14 ENCOUNTER — Emergency Department
Admission: EM | Admit: 2022-11-14 | Discharge: 2022-11-15 | Disposition: A | Discharge status: Home / Self Care | Payer: Medicaid Other | Attending: Emergency Medicine | Admitting: Emergency Medicine

## 2022-11-14 ENCOUNTER — Other Ambulatory Visit: Payer: Self-pay

## 2022-11-14 ENCOUNTER — Emergency Department: Payer: Medicaid Other

## 2022-11-14 DIAGNOSIS — X501XXA Overexertion from prolonged static or awkward postures, initial encounter: Secondary | ICD-10-CM | POA: Diagnosis not present

## 2022-11-14 DIAGNOSIS — Y92002 Bathroom of unspecified non-institutional (private) residence single-family (private) house as the place of occurrence of the external cause: Secondary | ICD-10-CM | POA: Diagnosis not present

## 2022-11-14 DIAGNOSIS — S82832A Other fracture of upper and lower end of left fibula, initial encounter for closed fracture: Secondary | ICD-10-CM | POA: Diagnosis not present

## 2022-11-14 DIAGNOSIS — S8262XA Displaced fracture of lateral malleolus of left fibula, initial encounter for closed fracture: Secondary | ICD-10-CM | POA: Insufficient documentation

## 2022-11-14 DIAGNOSIS — M25572 Pain in left ankle and joints of left foot: Secondary | ICD-10-CM | POA: Diagnosis not present

## 2022-11-14 MED ORDER — IBUPROFEN 600 MG PO TABS
600.0000 mg | ORAL_TABLET | Freq: Once | ORAL | Status: AC
  Administered 2022-11-14: 600 mg via ORAL
  Filled 2022-11-14: qty 1

## 2022-11-14 NOTE — ED Triage Notes (Signed)
Pt was putting up a new shower head in her bathroom while wearing boots. Went to step down and turned her left ankle.

## 2022-11-15 MED ORDER — OXYCODONE-ACETAMINOPHEN 5-325 MG PO TABS: 2.0000 | ORAL_TABLET | Freq: Four times a day (QID) | ORAL | 0 refills | Status: AC | PRN

## 2022-11-15 MED ORDER — OXYCODONE-ACETAMINOPHEN 5-325 MG PO TABS
2.0000 | ORAL_TABLET | Freq: Once | ORAL | Status: AC
  Administered 2022-11-15: 2 via ORAL
  Filled 2022-11-15: qty 2

## 2022-11-15 NOTE — Discharge Instructions (Addendum)
As we discussed, you have a small fracture to the left lateral malleolus.  It would be best to not bear weight on your left foot, at least until you can follow-up with orthopedics to discuss additional management.  This will take at least several weeks to heal.  Use your crutches in the meantime.  Use cold packs for the swelling and use over-the-counter ibuprofen and Tylenol for pain.  Take Percocet as prescribed for severe pain. Do not drink alcohol, drive or participate in any other potentially dangerous activities while taking this medication as it may make you sleepy. Do not take this medication with any other sedating medications, either prescription or over-the-counter. If you were prescribed Percocet or Vicodin, do not take these with acetaminophen (Tylenol) as it is already contained within these medications.   This medication is an opiate (or narcotic) pain medication and can be habit forming.  Use it as little as possible to achieve adequate pain control.  Do not use or use it with extreme caution if you have a history of opiate abuse or dependence.  If you are on a pain contract with your primary care doctor or a pain specialist, be sure to let them know you were prescribed this medication today from the [facility] Emergency Department.  This medication is intended for your use only - do not give any to anyone else and keep it in a secure place where nobody else, especially children, have access to it.  It will also cause or worsen constipation, so you may want to consider taking an over-the-counter stool softener while you are taking this medication.

## 2022-11-15 NOTE — ED Provider Notes (Signed)
[facility] Provider Note    Event Date/Time   First MD Initiated Contact with Patient 11/15/22 0002     (approximate)   History   Ankle Pain   HPI  [name] is a 32 y.o. female who presents for evaluation of pain in her left ankle.  She was reportedly trying to change the showerhead in her bathroom when she slipped and rolled her left ankle.  She had immediate pain and swelling on the outside of the ankle.  She said it is very severe and much worse if she moves it or puts weight on it.  She has had other orthopedic injuries in the past on the right leg and said that this feels worse.  She has some numbness and tingling in the foot.  She has been trying to rest it and she has crutches from prior injuries that she has used.  They injury occurred several hours ago.  No other injuries, no loss of consciousness, no headache or neck pain.     Physical Exam   Triage Vital Signs: ED Triage Vitals  Enc Vitals Group     BP 11/14/22 2132 (!) 126/93     Pulse Rate 11/14/22 2132 99     Resp 11/14/22 2132 18     Temp 11/14/22 2132 98.5 F (36.9 C)     Temp Source 11/14/22 2132 Oral     SpO2 11/14/22 2132 100 %     Weight 11/14/22 2134 77.1 kg (170 lb)     Height 11/14/22 2134 1.651 m (5\' 5" )     Head Circumference --      Peak Flow --      Pain Score 11/14/22 2133 10     Pain Loc --      Pain Edu? --      Excl. in [place]? --     Most recent vital signs: Vitals:   11/14/22 2132 11/15/22 0038  BP: (!) 126/93 131/88  Pulse: 99 98  Resp: 18 18  Temp: 98.5 F (36.9 C) 98.4 F (36.9 C)  SpO2: 100% 99%     General: Awake, no distress.  CV:  Good peripheral perfusion.  Easily palpable distal pulses in the affected foot. Resp:  Normal effort.  Abd:  No distention.  Other:  Swelling and ecchymosis to the left foot and ankle, primarily the left lateral malleolus.  Very tender to palpation with any manipulation of the joint.  Well-perfused, normal  capillary refill.  No tenderness to palpation proximal to the ankle including proximal tibia and fibula and no tenderness to palpation of the knee with flexion extension or palpation.   ED Results / Procedures / Treatments    RADIOLOGY I viewed and interpreted the patient's ankle x-rays and she has a small avulsion fracture, minimally displaced, to the left lateral malleolus.  No other injuries.    PROCEDURES:  Critical Care performed: No  Procedures   MEDICATIONS ORDERED IN ED: Medications  ibuprofen (ADVIL) tablet 600 mg (600 mg Oral Given 11/14/22 2159)  oxyCODONE-acetaminophen (PERCOCET/ROXICET) 5-325 MG per tablet 2 tablet (2 tablets Oral Given 11/15/22 0036)     IMPRESSION / MDM / ASSESSMENT AND PLAN / ED COURSE  I reviewed the triage vital signs and the nursing notes.                              Differential diagnosis includes, but is not limited  to, fracture, sprain, dislocation.  Patient's presentation is most consistent with acute complicated illness / injury requiring diagnostic workup.  Lab/studies ordered: Ankle x-rays.  Physical exam is consistent with the radiographic findings listed above, specifically avulsion fracture of the left lateral malleolus.  I considered a posterior short leg splint, but she is very worried about being able to bear weight because she has a toddler and is a [employer].  After further consideration and investigation, I provided a walking boot which should provide sufficient immobilization but allow her to ambulate and bear weight as needed.  However I stressed to her that she should avoid bearing weight as much as possible, elevate, use cold therapy, etc.  She understands and agrees with the plan.  I ordered 2 Percocet and gave her a prescription for Percocet as an outpatient after reviewing the [place] controlled substance database and verified that there are no concerning prescribing habits.  I gave her information about an  orthopedics clinic with which she can follow-up although she has an established orthopedic surgeon in [place] that she will most likely call.  I gave her my usual return precautions.       FINAL CLINICAL IMPRESSION(S) / ED DIAGNOSES   Final diagnoses:  Closed displaced fracture of lateral malleolus of left fibula, initial encounter     Rx / DC Orders   ED Discharge Orders          Ordered    oxyCODONE-acetaminophen (PERCOCET) 5-325 MG tablet  Every 6 hours PRN        11/15/22 0019             Note:  This document was prepared using Dragon voice recognition software and may include unintentional dictation errors.   Hinda Kehr, MD 11/15/22 [phone]

## 2022-11-17 ENCOUNTER — Telehealth: Payer: Self-pay

## 2022-11-17 NOTE — Patient Outreach (Signed)
Transition Care Management Unsuccessful Follow-up Telephone Call  Date of discharge and from where:  11/15/22 [facility]  Attempts:  1st Attempt  Reason for unsuccessful TCM follow-up call:  Left voice message  Mickel Fuchs, BSW, [place] Medicaid Team  [phone]

## 2022-11-18 DIAGNOSIS — M25572 Pain in left ankle and joints of left foot: Secondary | ICD-10-CM | POA: Diagnosis not present

## 2022-11-18 DIAGNOSIS — S82839A Other fracture of upper and lower end of unspecified fibula, initial encounter for closed fracture: Secondary | ICD-10-CM | POA: Diagnosis not present

## 2022-12-08 DIAGNOSIS — S82832A Other fracture of upper and lower end of left fibula, initial encounter for closed fracture: Secondary | ICD-10-CM | POA: Diagnosis not present

## 2023-07-08 DIAGNOSIS — Z03818 Encounter for observation for suspected exposure to other biological agents ruled out: Secondary | ICD-10-CM | POA: Diagnosis not present

## 2023-07-08 DIAGNOSIS — R051 Acute cough: Secondary | ICD-10-CM | POA: Diagnosis not present

## 2023-07-08 DIAGNOSIS — Z23 Encounter for immunization: Secondary | ICD-10-CM | POA: Diagnosis not present

## 2023-07-27 DIAGNOSIS — R5383 Other fatigue: Secondary | ICD-10-CM | POA: Diagnosis not present

## 2023-07-27 DIAGNOSIS — M791 Myalgia, unspecified site: Secondary | ICD-10-CM | POA: Diagnosis not present

## 2023-07-27 DIAGNOSIS — Z01411 Encounter for gynecological examination (general) (routine) with abnormal findings: Secondary | ICD-10-CM | POA: Diagnosis not present

## 2023-08-06 DIAGNOSIS — R899 Unspecified abnormal finding in specimens from other organs, systems and tissues: Secondary | ICD-10-CM | POA: Diagnosis not present

## 2023-08-06 DIAGNOSIS — R5383 Other fatigue: Secondary | ICD-10-CM | POA: Diagnosis not present

## 2023-09-03 DIAGNOSIS — R5383 Other fatigue: Secondary | ICD-10-CM | POA: Diagnosis not present

## 2023-10-07 DIAGNOSIS — R5383 Other fatigue: Secondary | ICD-10-CM | POA: Diagnosis not present

## 2024-02-23 DIAGNOSIS — J028 Acute pharyngitis due to other specified organisms: Secondary | ICD-10-CM | POA: Diagnosis not present

## 2024-02-23 DIAGNOSIS — Z03818 Encounter for observation for suspected exposure to other biological agents ruled out: Secondary | ICD-10-CM | POA: Diagnosis not present

## 2024-03-07 DIAGNOSIS — R509 Fever, unspecified: Secondary | ICD-10-CM | POA: Diagnosis not present

## 2024-03-07 DIAGNOSIS — R822 Biliuria: Secondary | ICD-10-CM | POA: Diagnosis not present

## 2024-03-07 DIAGNOSIS — Z03818 Encounter for observation for suspected exposure to other biological agents ruled out: Secondary | ICD-10-CM | POA: Diagnosis not present

## 2024-03-07 DIAGNOSIS — L509 Urticaria, unspecified: Secondary | ICD-10-CM | POA: Diagnosis not present

## 2024-03-07 DIAGNOSIS — R82998 Other abnormal findings in urine: Secondary | ICD-10-CM | POA: Diagnosis not present
